# Patient Record
Sex: Male | Born: 1950 | Race: White | Hispanic: No
Health system: Southern US, Community
[De-identification: ages and names within clinical notes are randomized; demographics above are authoritative.]

---

## 2004-03-02 ENCOUNTER — Other Ambulatory Visit: Payer: Self-pay

## 2004-05-03 ENCOUNTER — Ambulatory Visit: Payer: Self-pay | Admitting: Urology

## 2004-05-11 ENCOUNTER — Ambulatory Visit: Payer: Self-pay | Admitting: Radiation Oncology

## 2004-06-01 ENCOUNTER — Ambulatory Visit: Payer: Self-pay | Admitting: Radiation Oncology

## 2004-07-01 ENCOUNTER — Ambulatory Visit: Payer: Self-pay | Admitting: Radiation Oncology

## 2004-07-14 ENCOUNTER — Ambulatory Visit: Payer: Self-pay | Admitting: Urology

## 2004-08-04 ENCOUNTER — Ambulatory Visit: Payer: Self-pay | Admitting: Anesthesiology

## 2004-08-19 ENCOUNTER — Ambulatory Visit: Payer: Self-pay | Admitting: Radiation Oncology

## 2004-08-31 ENCOUNTER — Ambulatory Visit: Payer: Self-pay | Admitting: Anesthesiology

## 2004-09-01 ENCOUNTER — Ambulatory Visit: Payer: Self-pay | Admitting: Radiation Oncology

## 2004-09-28 ENCOUNTER — Ambulatory Visit: Payer: Self-pay | Admitting: Anesthesiology

## 2004-11-01 ENCOUNTER — Ambulatory Visit: Payer: Self-pay | Admitting: Anesthesiology

## 2004-11-10 ENCOUNTER — Ambulatory Visit: Payer: Self-pay

## 2004-12-14 ENCOUNTER — Ambulatory Visit: Payer: Self-pay | Admitting: Anesthesiology

## 2004-12-22 ENCOUNTER — Ambulatory Visit: Payer: Self-pay | Admitting: Radiation Oncology

## 2004-12-30 ENCOUNTER — Ambulatory Visit: Payer: Self-pay | Admitting: Radiation Oncology

## 2005-01-12 ENCOUNTER — Ambulatory Visit: Payer: Self-pay | Admitting: Anesthesiology

## 2005-03-09 ENCOUNTER — Ambulatory Visit: Payer: Self-pay | Admitting: Anesthesiology

## 2005-04-14 ENCOUNTER — Ambulatory Visit: Payer: Self-pay | Admitting: Anesthesiology

## 2005-05-10 ENCOUNTER — Ambulatory Visit: Payer: Self-pay | Admitting: Anesthesiology

## 2005-06-16 ENCOUNTER — Ambulatory Visit: Payer: Self-pay | Admitting: Anesthesiology

## 2005-06-27 ENCOUNTER — Ambulatory Visit: Payer: Self-pay | Admitting: Radiation Oncology

## 2005-07-01 ENCOUNTER — Ambulatory Visit: Payer: Self-pay | Admitting: Radiation Oncology

## 2005-08-25 ENCOUNTER — Ambulatory Visit: Payer: Self-pay | Admitting: Anesthesiology

## 2005-09-26 ENCOUNTER — Ambulatory Visit: Payer: Self-pay | Admitting: Anesthesiology

## 2005-10-31 ENCOUNTER — Ambulatory Visit: Payer: Self-pay | Admitting: Anesthesiology

## 2005-12-19 ENCOUNTER — Ambulatory Visit: Payer: Self-pay | Admitting: Anesthesiology

## 2006-01-16 ENCOUNTER — Ambulatory Visit: Payer: Self-pay | Admitting: Anesthesiology

## 2006-03-21 ENCOUNTER — Ambulatory Visit: Payer: Self-pay | Admitting: Specialist

## 2006-05-25 ENCOUNTER — Ambulatory Visit: Payer: Self-pay | Admitting: Anesthesiology

## 2006-06-29 ENCOUNTER — Ambulatory Visit: Payer: Self-pay | Admitting: Anesthesiology

## 2006-08-15 ENCOUNTER — Ambulatory Visit: Payer: Self-pay | Admitting: Anesthesiology

## 2006-09-20 ENCOUNTER — Ambulatory Visit: Payer: Self-pay | Admitting: Anesthesiology

## 2006-10-26 ENCOUNTER — Ambulatory Visit: Payer: Self-pay | Admitting: Anesthesiology

## 2006-11-22 ENCOUNTER — Ambulatory Visit: Payer: Self-pay | Admitting: Anesthesiology

## 2006-12-27 ENCOUNTER — Ambulatory Visit: Payer: Self-pay | Admitting: Anesthesiology

## 2007-03-01 ENCOUNTER — Ambulatory Visit: Payer: Self-pay | Admitting: Anesthesiology

## 2007-04-11 ENCOUNTER — Ambulatory Visit: Payer: Self-pay | Admitting: Anesthesiology

## 2007-07-20 ENCOUNTER — Other Ambulatory Visit: Payer: Self-pay

## 2007-07-20 ENCOUNTER — Inpatient Hospital Stay: Payer: Self-pay | Admitting: Internal Medicine

## 2007-11-23 ENCOUNTER — Ambulatory Visit: Payer: Self-pay | Admitting: Otolaryngology

## 2008-06-12 ENCOUNTER — Inpatient Hospital Stay: Payer: Self-pay | Admitting: Internal Medicine

## 2008-07-09 ENCOUNTER — Encounter: Payer: Self-pay | Admitting: Internal Medicine

## 2008-08-01 ENCOUNTER — Encounter: Payer: Self-pay | Admitting: Internal Medicine

## 2008-09-01 ENCOUNTER — Encounter: Payer: Self-pay | Admitting: Internal Medicine

## 2008-09-29 ENCOUNTER — Encounter: Payer: Self-pay | Admitting: Internal Medicine

## 2008-10-30 ENCOUNTER — Encounter: Payer: Self-pay | Admitting: Internal Medicine

## 2008-11-29 ENCOUNTER — Encounter: Payer: Self-pay | Admitting: Internal Medicine

## 2010-03-24 ENCOUNTER — Inpatient Hospital Stay: Payer: Self-pay | Admitting: Internal Medicine

## 2010-04-07 ENCOUNTER — Inpatient Hospital Stay: Payer: Self-pay | Admitting: Internal Medicine

## 2010-04-21 ENCOUNTER — Inpatient Hospital Stay: Payer: Self-pay | Admitting: Internal Medicine

## 2010-05-07 ENCOUNTER — Observation Stay: Payer: Self-pay | Admitting: Internal Medicine

## 2012-04-17 ENCOUNTER — Ambulatory Visit: Payer: Self-pay | Admitting: Specialist

## 2012-04-24 ENCOUNTER — Ambulatory Visit: Payer: Self-pay | Admitting: Gastroenterology

## 2012-04-26 LAB — PATHOLOGY REPORT

## 2012-09-29 ENCOUNTER — Ambulatory Visit: Payer: Self-pay | Admitting: Internal Medicine

## 2012-10-07 ENCOUNTER — Inpatient Hospital Stay: Payer: Self-pay | Admitting: Student

## 2012-10-07 LAB — COMPREHENSIVE METABOLIC PANEL
Albumin: 3.1 g/dL — ABNORMAL LOW (ref 3.4–5.0)
Alkaline Phosphatase: 114 U/L (ref 50–136)
BUN: 14 mg/dL (ref 7–18)
Bilirubin,Total: 0.6 mg/dL (ref 0.2–1.0)
Calcium, Total: 9.2 mg/dL (ref 8.5–10.1)
Chloride: 86 mmol/L — ABNORMAL LOW (ref 98–107)
Creatinine: 0.72 mg/dL (ref 0.60–1.30)
Glucose: 75 mg/dL (ref 65–99)
Osmolality: 240 (ref 275–301)
Sodium: 119 mmol/L — CL (ref 136–145)
Total Protein: 8.2 g/dL (ref 6.4–8.2)

## 2012-10-07 LAB — CBC
HCT: 36.1 % — ABNORMAL LOW (ref 40.0–52.0)
HGB: 11.6 g/dL — ABNORMAL LOW (ref 13.0–18.0)
MCV: 76 fL — ABNORMAL LOW (ref 80–100)
RDW: 17.6 % — ABNORMAL HIGH (ref 11.5–14.5)

## 2012-10-07 LAB — TROPONIN I: Troponin-I: <0.02 ng/mL

## 2012-10-07 LAB — URINALYSIS, COMPLETE
Bacteria: NONE SEEN
Glucose,UR: NEGATIVE mg/dL (ref 0–75)
Hyaline Cast: 7
Protein: 30
RBC,UR: 3616 /HPF (ref 0–5)
Specific Gravity: 1.013 (ref 1.003–1.030)

## 2012-10-07 LAB — PRO B NATRIURETIC PEPTIDE: B-Type Natriuretic Peptide: 4300 pg/mL — ABNORMAL HIGH (ref 0–125)

## 2012-10-08 LAB — COMPREHENSIVE METABOLIC PANEL
Albumin: 2.5 g/dL — ABNORMAL LOW (ref 3.4–5.0)
BUN: 9 mg/dL (ref 7–18)
Bilirubin,Total: 0.3 mg/dL (ref 0.2–1.0)
Calcium, Total: 8.6 mg/dL (ref 8.5–10.1)
Chloride: 96 mmol/L — ABNORMAL LOW (ref 98–107)
Co2: 25 mmol/L (ref 21–32)
Creatinine: 0.5 mg/dL — ABNORMAL LOW (ref 0.60–1.30)
EGFR (African American): >60
EGFR (Non-African Amer.): >60
Glucose: 152 mg/dL — ABNORMAL HIGH (ref 65–99)
Osmolality: 257 (ref 275–301)
Potassium: 3.9 mmol/L (ref 3.5–5.1)
SGPT (ALT): 19 U/L (ref 12–78)

## 2012-10-08 LAB — CBC WITH DIFFERENTIAL/PLATELET
Basophil #: 0 x10 3/mm 3 (ref 0.0–0.1)
Basophil %: 0.1 %
Eosinophil #: 0 x10 3/mm 3 (ref 0.0–0.7)
Eosinophil %: 0 %
HCT: 30.4 % — ABNORMAL LOW (ref 40.0–52.0)
HGB: 10.1 g/dL — ABNORMAL LOW (ref 13.0–18.0)
Lymphocyte %: 0.9 %
Lymphs Abs: 0.2 x10 3/mm 3 — ABNORMAL LOW (ref 1.0–3.6)
MCH: 25.1 pg — ABNORMAL LOW (ref 26.0–34.0)
MCHC: 33.2 g/dL (ref 32.0–36.0)
MCV: 76 fL — ABNORMAL LOW (ref 80–100)
Monocyte #: 0.3 x10 3/mm (ref 0.2–1.0)
Monocyte %: 1.3 %
Neutrophil #: 22.6 x10 3/mm 3 — ABNORMAL HIGH (ref 1.4–6.5)
Neutrophil %: 97.7 %
Platelet: 136 x10 3/mm 3 — ABNORMAL LOW (ref 150–440)
RBC: 4.01 x10 6/mm 3 — ABNORMAL LOW (ref 4.40–5.90)
RDW: 17.2 % — ABNORMAL HIGH (ref 11.5–14.5)
WBC: 23.1 x10 3/mm 3 — ABNORMAL HIGH (ref 3.8–10.6)

## 2012-10-08 LAB — TROPONIN I: Troponin-I: <0.02 ng/mL

## 2012-10-09 LAB — URINALYSIS, COMPLETE
Bacteria: NONE SEEN
Bilirubin,UR: NEGATIVE
Blood: NEGATIVE
Glucose,UR: NEGATIVE mg/dL (ref 0–75)
Ketone: NEGATIVE
Leukocyte Esterase: NEGATIVE
Nitrite: NEGATIVE
Ph: 7 (ref 4.5–8.0)
Protein: NEGATIVE
RBC,UR: NONE SEEN /HPF (ref 0–5)
Specific Gravity: 1.009 (ref 1.003–1.030)
Squamous Epithelial: <1
WBC UR: <1 /HPF (ref 0–5)

## 2012-10-09 LAB — CBC WITH DIFFERENTIAL/PLATELET
Eosinophil #: 0 10*3/uL (ref 0.0–0.7)
Eosinophil %: 0 %
HCT: 29.4 % — ABNORMAL LOW (ref 40.0–52.0)
HGB: 9.4 g/dL — ABNORMAL LOW (ref 13.0–18.0)
Lymphocyte #: 0.4 10*3/uL — ABNORMAL LOW (ref 1.0–3.6)
MCH: 24.7 pg — ABNORMAL LOW (ref 26.0–34.0)
MCV: 77 fL — ABNORMAL LOW (ref 80–100)
Monocyte #: 0.3 x10 3/mm (ref 0.2–1.0)
Neutrophil #: 21 10*3/uL — ABNORMAL HIGH (ref 1.4–6.5)
RBC: 3.82 10*6/uL — ABNORMAL LOW (ref 4.40–5.90)
RDW: 17.6 % — ABNORMAL HIGH (ref 11.5–14.5)
WBC: 22 10*3/uL — ABNORMAL HIGH (ref 3.8–10.6)

## 2012-10-09 LAB — SODIUM: Sodium: 133 mmol/L — ABNORMAL LOW (ref 136–145)

## 2012-10-10 LAB — CBC WITH DIFFERENTIAL/PLATELET
Basophil #: 0 10*3/uL (ref 0.0–0.1)
Basophil %: 0.1 %
Eosinophil %: 0 %
HCT: 30.1 % — ABNORMAL LOW (ref 40.0–52.0)
HGB: 9.4 g/dL — ABNORMAL LOW (ref 13.0–18.0)
Lymphocyte #: 1.2 10*3/uL (ref 1.0–3.6)
MCV: 77 fL — ABNORMAL LOW (ref 80–100)
Monocyte #: 0.9 x10 3/mm (ref 0.2–1.0)
Monocyte %: 5 %
RBC: 3.88 10*6/uL — ABNORMAL LOW (ref 4.40–5.90)
WBC: 18.7 10*3/uL — ABNORMAL HIGH (ref 3.8–10.6)

## 2012-10-10 LAB — VANCOMYCIN, TROUGH: Vancomycin, Trough: 6 ug/mL — ABNORMAL LOW (ref 10–20)

## 2012-10-11 LAB — BASIC METABOLIC PANEL
BUN: 11 mg/dL (ref 7–18)
Calcium, Total: 9 mg/dL (ref 8.5–10.1)
Chloride: 89 mmol/L — ABNORMAL LOW (ref 98–107)
EGFR (Non-African Amer.): >60
Glucose: 72 mg/dL (ref 65–99)

## 2012-10-12 LAB — SODIUM: Sodium: 130 mmol/L — ABNORMAL LOW (ref 136–145)

## 2012-10-13 LAB — CBC WITH DIFFERENTIAL/PLATELET
Basophil #: 0.1 10*3/uL (ref 0.0–0.1)
Basophil %: 0.4 %
HCT: 34.6 % — ABNORMAL LOW (ref 40.0–52.0)
MCH: 24.8 pg — ABNORMAL LOW (ref 26.0–34.0)
MCHC: 31.7 g/dL — ABNORMAL LOW (ref 32.0–36.0)
MCV: 78 fL — ABNORMAL LOW (ref 80–100)
Neutrophil %: 76.7 %
RBC: 4.42 10*6/uL (ref 4.40–5.90)
RDW: 17.9 % — ABNORMAL HIGH (ref 11.5–14.5)
WBC: 18 10*3/uL — ABNORMAL HIGH (ref 3.8–10.6)

## 2012-10-13 LAB — SODIUM: Sodium: 130 mmol/L — ABNORMAL LOW (ref 136–145)

## 2012-10-13 LAB — CULTURE, BLOOD (SINGLE)

## 2012-10-14 LAB — CBC WITH DIFFERENTIAL/PLATELET
Eosinophil #: 0.2 10*3/uL (ref 0.0–0.7)
Eosinophil %: 0.7 %
HGB: 11.3 g/dL — ABNORMAL LOW (ref 13.0–18.0)
Lymphocyte #: 1.4 10*3/uL (ref 1.0–3.6)
MCH: 24.7 pg — ABNORMAL LOW (ref 26.0–34.0)
MCHC: 31.6 g/dL — ABNORMAL LOW (ref 32.0–36.0)
MCV: 78 fL — ABNORMAL LOW (ref 80–100)
Monocyte #: 2.2 x10 3/mm — ABNORMAL HIGH (ref 0.2–1.0)
Monocyte %: 8.6 %
Neutrophil %: 85.1 %
RBC: 4.55 10*6/uL (ref 4.40–5.90)
WBC: 25.4 10*3/uL — ABNORMAL HIGH (ref 3.8–10.6)

## 2012-10-14 LAB — PLATELET COUNT: Platelet: 411 10*3/uL (ref 150–440)

## 2012-10-14 LAB — EXPECTORATED SPUTUM ASSESSMENT W GRAM STAIN, RFLX TO RESP C

## 2012-10-15 LAB — CBC WITH DIFFERENTIAL/PLATELET
Basophil %: 1 %
Eosinophil #: 0.3 10*3/uL (ref 0.0–0.7)
Eosinophil %: 1.5 %
HCT: 32.7 % — ABNORMAL LOW (ref 40.0–52.0)
Lymphocyte %: 4.6 %
MCH: 25.2 pg — ABNORMAL LOW (ref 26.0–34.0)
MCHC: 32.2 g/dL (ref 32.0–36.0)
MCV: 78 fL — ABNORMAL LOW (ref 80–100)
Monocyte %: 7.4 %
Neutrophil #: 17.4 10*3/uL — ABNORMAL HIGH (ref 1.4–6.5)
Neutrophil %: 85.5 %
Platelet: 303 10*3/uL (ref 150–440)
RDW: 18.4 % — ABNORMAL HIGH (ref 11.5–14.5)
WBC: 20.4 10*3/uL — ABNORMAL HIGH (ref 3.8–10.6)

## 2012-10-15 LAB — BASIC METABOLIC PANEL
Anion Gap: 4 — ABNORMAL LOW (ref 7–16)
BUN: 25 mg/dL — ABNORMAL HIGH (ref 7–18)
Calcium, Total: 9.1 mg/dL (ref 8.5–10.1)
Chloride: 97 mmol/L — ABNORMAL LOW (ref 98–107)
Co2: 32 mmol/L (ref 21–32)
Creatinine: 0.41 mg/dL — ABNORMAL LOW (ref 0.60–1.30)
EGFR (Non-African Amer.): >60
Glucose: 91 mg/dL (ref 65–99)
Osmolality: 270 (ref 275–301)
Sodium: 133 mmol/L — ABNORMAL LOW (ref 136–145)

## 2012-10-16 LAB — CBC WITH DIFFERENTIAL/PLATELET
Basophil #: 0.1 10*3/uL (ref 0.0–0.1)
Basophil %: 0.4 %
Eosinophil #: 0.1 10*3/uL (ref 0.0–0.7)
Eosinophil %: 0.7 %
HCT: 31.2 % — ABNORMAL LOW (ref 40.0–52.0)
HGB: 9.8 g/dL — ABNORMAL LOW (ref 13.0–18.0)
Lymphocyte #: 0.7 10*3/uL — ABNORMAL LOW (ref 1.0–3.6)
MCHC: 31.5 g/dL — ABNORMAL LOW (ref 32.0–36.0)
Monocyte #: 1.5 x10 3/mm — ABNORMAL HIGH (ref 0.2–1.0)
Platelet: 139 10*3/uL — ABNORMAL LOW (ref 150–440)
RDW: 18.6 % — ABNORMAL HIGH (ref 11.5–14.5)
WBC: 19.6 10*3/uL — ABNORMAL HIGH (ref 3.8–10.6)

## 2012-10-16 LAB — CULTURE, BLOOD (SINGLE)

## 2012-10-30 ENCOUNTER — Ambulatory Visit: Payer: Self-pay | Admitting: Internal Medicine

## 2012-11-14 ENCOUNTER — Emergency Department: Payer: Self-pay | Admitting: Unknown Physician Specialty

## 2012-11-14 LAB — COMPREHENSIVE METABOLIC PANEL
Albumin: 3.5 g/dL (ref 3.4–5.0)
Alkaline Phosphatase: 108 U/L (ref 50–136)
Anion Gap: 7 (ref 7–16)
Chloride: 90 mmol/L — ABNORMAL LOW (ref 98–107)
Co2: 26 mmol/L (ref 21–32)
EGFR (African American): >60
Glucose: 63 mg/dL — ABNORMAL LOW (ref 65–99)
Osmolality: 244 (ref 275–301)
SGOT(AST): 30 U/L (ref 15–37)
SGPT (ALT): 19 U/L (ref 12–78)
Sodium: 123 mmol/L — ABNORMAL LOW (ref 136–145)

## 2012-11-14 LAB — URINALYSIS, COMPLETE
Bilirubin,UR: NEGATIVE
Ketone: NEGATIVE
Nitrite: NEGATIVE
Ph: 6 (ref 4.5–8.0)
RBC,UR: 4596 /HPF (ref 0–5)
Specific Gravity: 1.011 (ref 1.003–1.030)
WBC UR: 27 /HPF (ref 0–5)

## 2012-11-14 LAB — CBC
HGB: 10.8 g/dL — ABNORMAL LOW (ref 13.0–18.0)
MCH: 25.5 pg — ABNORMAL LOW (ref 26.0–34.0)
MCV: 79 fL — ABNORMAL LOW (ref 80–100)
Platelet: 297 10*3/uL (ref 150–440)
RDW: 18.6 % — ABNORMAL HIGH (ref 11.5–14.5)
WBC: 13.8 10*3/uL — ABNORMAL HIGH (ref 3.8–10.6)

## 2012-11-14 LAB — PROTIME-INR
INR: 1.1
Prothrombin Time: 14.5 secs (ref 11.5–14.7)

## 2012-11-14 LAB — APTT: Activated PTT: 35 secs (ref 23.6–35.9)

## 2012-11-15 LAB — URINE CULTURE

## 2013-03-01 ENCOUNTER — Ambulatory Visit: Payer: Self-pay | Admitting: Internal Medicine

## 2013-03-03 ENCOUNTER — Inpatient Hospital Stay: Payer: Self-pay | Admitting: Internal Medicine

## 2013-03-03 LAB — COMPREHENSIVE METABOLIC PANEL
Alkaline Phosphatase: 106 U/L (ref 50–136)
Anion Gap: 8 (ref 7–16)
Bilirubin,Total: 0.4 mg/dL (ref 0.2–1.0)
Chloride: 84 mmol/L — ABNORMAL LOW (ref 98–107)
Creatinine: 0.9 mg/dL (ref 0.60–1.30)
EGFR (African American): >60
EGFR (Non-African Amer.): >60
Glucose: 91 mg/dL (ref 65–99)
Osmolality: 237 (ref 275–301)
Potassium: 5 mmol/L (ref 3.5–5.1)
SGPT (ALT): 22 U/L (ref 12–78)
Total Protein: 7.6 g/dL (ref 6.4–8.2)

## 2013-03-03 LAB — URINALYSIS, COMPLETE
Bacteria: NONE SEEN
Blood: NEGATIVE
Glucose,UR: NEGATIVE mg/dL (ref 0–75)
Leukocyte Esterase: NEGATIVE
Ph: 7 (ref 4.5–8.0)
RBC,UR: <1 /HPF (ref 0–5)
Specific Gravity: 1.013 (ref 1.003–1.030)
WBC UR: 2 /HPF (ref 0–5)

## 2013-03-03 LAB — HEPATIC FUNCTION PANEL A (ARMC)
Albumin: 2.8 g/dL — ABNORMAL LOW (ref 3.4–5.0)
Bilirubin,Total: 0.4 mg/dL (ref 0.2–1.0)
SGOT(AST): 85 U/L — ABNORMAL HIGH (ref 15–37)
SGPT (ALT): 22 U/L (ref 12–78)
Total Protein: 7.3 g/dL (ref 6.4–8.2)

## 2013-03-03 LAB — CBC
HGB: 11.1 g/dL — ABNORMAL LOW (ref 13.0–18.0)
MCHC: 34.3 g/dL (ref 32.0–36.0)
RBC: 4.33 10*6/uL — ABNORMAL LOW (ref 4.40–5.90)
RDW: 15.5 % — ABNORMAL HIGH (ref 11.5–14.5)
WBC: 29.4 10*3/uL — ABNORMAL HIGH (ref 3.8–10.6)

## 2013-03-03 LAB — CK TOTAL AND CKMB (NOT AT ARMC)
CK, Total: 4064 U/L — ABNORMAL HIGH (ref 35–232)
CK-MB: 4.1 ng/mL — ABNORMAL HIGH (ref 0.5–3.6)
CK-MB: 3.9 ng/mL — ABNORMAL HIGH (ref 0.5–3.6)

## 2013-03-03 LAB — LIPASE, BLOOD: Lipase: 39 U/L — ABNORMAL LOW (ref 73–393)

## 2013-03-03 LAB — TROPONIN I: Troponin-I: <0.02 ng/mL

## 2013-03-04 LAB — CBC WITH DIFFERENTIAL/PLATELET
Basophil #: 0 10*3/uL (ref 0.0–0.1)
Basophil %: 0.2 %
Eosinophil %: 0.2 %
HCT: 27.9 % — ABNORMAL LOW (ref 40.0–52.0)
Lymphocyte #: 0.5 10*3/uL — ABNORMAL LOW (ref 1.0–3.6)
Lymphocyte %: 2.9 %
MCV: 74 fL — ABNORMAL LOW (ref 80–100)
Monocyte #: 1.3 x10 3/mm — ABNORMAL HIGH (ref 0.2–1.0)
Neutrophil #: 16.5 10*3/uL — ABNORMAL HIGH (ref 1.4–6.5)
Platelet: 184 10*3/uL (ref 150–440)
RDW: 15.6 % — ABNORMAL HIGH (ref 11.5–14.5)
WBC: 18.4 10*3/uL — ABNORMAL HIGH (ref 3.8–10.6)

## 2013-03-04 LAB — BASIC METABOLIC PANEL
Anion Gap: 7 (ref 7–16)
Creatinine: 0.54 mg/dL — ABNORMAL LOW (ref 0.60–1.30)
Glucose: 80 mg/dL (ref 65–99)
Osmolality: 254 (ref 275–301)
Potassium: 4 mmol/L (ref 3.5–5.1)
Sodium: 128 mmol/L — ABNORMAL LOW (ref 136–145)

## 2013-03-04 LAB — VANCOMYCIN, TROUGH: Vancomycin, Trough: 7 ug/mL — ABNORMAL LOW (ref 10–20)

## 2013-03-04 LAB — URINE CULTURE

## 2013-03-05 LAB — BASIC METABOLIC PANEL
Anion Gap: 3 — ABNORMAL LOW (ref 7–16)
BUN: 6 mg/dL — ABNORMAL LOW (ref 7–18)
Calcium, Total: 8.7 mg/dL (ref 8.5–10.1)
Chloride: 97 mmol/L — ABNORMAL LOW (ref 98–107)
Co2: 31 mmol/L (ref 21–32)
Creatinine: 0.4 mg/dL — ABNORMAL LOW (ref 0.60–1.30)
EGFR (Non-African Amer.): >60
Glucose: 82 mg/dL (ref 65–99)
Osmolality: 259 (ref 275–301)
Sodium: 131 mmol/L — ABNORMAL LOW (ref 136–145)

## 2013-03-05 LAB — CBC WITH DIFFERENTIAL/PLATELET
Eosinophil #: 0.1 10*3/uL (ref 0.0–0.7)
Eosinophil %: 0.8 %
HCT: 28.2 % — ABNORMAL LOW (ref 40.0–52.0)
HGB: 9.6 g/dL — ABNORMAL LOW (ref 13.0–18.0)
Lymphocyte %: 6.1 %
Monocyte #: 1.3 x10 3/mm — ABNORMAL HIGH (ref 0.2–1.0)
Monocyte %: 13.2 %
Neutrophil %: 79.4 %
Platelet: 213 10*3/uL (ref 150–440)
RDW: 15.6 % — ABNORMAL HIGH (ref 11.5–14.5)

## 2013-03-05 LAB — IRON AND TIBC
Iron Bind.Cap.(Total): 185 ug/dL — ABNORMAL LOW (ref 250–450)
Iron Saturation: 11 %
Iron: 21 ug/dL — ABNORMAL LOW (ref 65–175)
Unbound Iron-Bind.Cap.: 164 ug/dL

## 2013-03-05 LAB — VANCOMYCIN, TROUGH: Vancomycin, Trough: 19 ug/mL (ref 10–20)

## 2013-03-05 LAB — CK: CK, Total: 496 U/L — ABNORMAL HIGH (ref 35–232)

## 2013-03-06 LAB — CBC WITH DIFFERENTIAL/PLATELET
Basophil %: 0.5 %
Eosinophil #: 0.1 10*3/uL (ref 0.0–0.7)
Eosinophil %: 1.1 %
HGB: 10.1 g/dL — ABNORMAL LOW (ref 13.0–18.0)
Lymphocyte #: 0.8 10*3/uL — ABNORMAL LOW (ref 1.0–3.6)
MCH: 25.5 pg — ABNORMAL LOW (ref 26.0–34.0)
Neutrophil #: 11.4 10*3/uL — ABNORMAL HIGH (ref 1.4–6.5)
Neutrophil %: 82.1 %
Platelet: 126 10*3/uL — ABNORMAL LOW (ref 150–440)
RDW: 16 % — ABNORMAL HIGH (ref 11.5–14.5)
WBC: 13.9 10*3/uL — ABNORMAL HIGH (ref 3.8–10.6)

## 2013-03-06 LAB — CK: CK, Total: 171 U/L (ref 35–232)

## 2013-03-06 LAB — BASIC METABOLIC PANEL
Chloride: 90 mmol/L — ABNORMAL LOW (ref 98–107)
Co2: 34 mmol/L — ABNORMAL HIGH (ref 21–32)
Creatinine: 0.37 mg/dL — ABNORMAL LOW (ref 0.60–1.30)
EGFR (African American): >60
EGFR (Non-African Amer.): >60
Glucose: 99 mg/dL (ref 65–99)
Osmolality: 256 (ref 275–301)
Potassium: 4.4 mmol/L (ref 3.5–5.1)
Sodium: 129 mmol/L — ABNORMAL LOW (ref 136–145)

## 2013-03-07 LAB — CBC WITH DIFFERENTIAL/PLATELET
Basophil #: 0.1 10*3/uL (ref 0.0–0.1)
Basophil %: 0.5 %
Eosinophil #: 0.2 10*3/uL (ref 0.0–0.7)
HCT: 34.7 % — ABNORMAL LOW (ref 40.0–52.0)
HGB: 11.6 g/dL — ABNORMAL LOW (ref 13.0–18.0)
Lymphocyte #: 1.1 10*3/uL (ref 1.0–3.6)
Lymphocyte %: 8.6 %
MCV: 77 fL — ABNORMAL LOW (ref 80–100)
Monocyte #: 1.6 x10 3/mm — ABNORMAL HIGH (ref 0.2–1.0)
Monocyte %: 13.4 %
Neutrophil #: 9.3 10*3/uL — ABNORMAL HIGH (ref 1.4–6.5)
Platelet: 244 10*3/uL (ref 150–440)
RBC: 4.53 10*6/uL (ref 4.40–5.90)
RDW: 15.7 % — ABNORMAL HIGH (ref 11.5–14.5)

## 2013-03-07 LAB — OCCULT BLOOD X 1 CARD TO LAB, STOOL: Occult Blood, Feces: NEGATIVE

## 2013-03-07 LAB — BASIC METABOLIC PANEL
Anion Gap: 2 — ABNORMAL LOW (ref 7–16)
BUN: 8 mg/dL (ref 7–18)
Chloride: 92 mmol/L — ABNORMAL LOW (ref 98–107)
EGFR (Non-African Amer.): >60
Osmolality: 258 (ref 275–301)
Potassium: 4.7 mmol/L (ref 3.5–5.1)

## 2013-03-08 LAB — CULTURE, BLOOD (SINGLE)

## 2013-03-10 LAB — CULTURE, BLOOD (SINGLE)

## 2013-04-01 ENCOUNTER — Ambulatory Visit: Payer: Self-pay | Admitting: Internal Medicine

## 2013-04-11 ENCOUNTER — Ambulatory Visit: Payer: Self-pay | Admitting: Specialist

## 2013-04-13 LAB — CBC WITH DIFFERENTIAL/PLATELET
Basophil #: 0.1 10*3/uL (ref 0.0–0.1)
Eosinophil #: 0.2 10*3/uL (ref 0.0–0.7)
HCT: 35.4 % — ABNORMAL LOW (ref 40.0–52.0)
HGB: 11.8 g/dL — ABNORMAL LOW (ref 13.0–18.0)
Lymphocyte #: 0.7 10*3/uL — ABNORMAL LOW (ref 1.0–3.6)
Lymphocyte %: 3.8 %
MCH: 25.1 pg — ABNORMAL LOW (ref 26.0–34.0)
MCV: 76 fL — ABNORMAL LOW (ref 80–100)
Monocyte #: 0.6 x10 3/mm (ref 0.2–1.0)
Neutrophil %: 91.5 %
Platelet: 146 10*3/uL — ABNORMAL LOW (ref 150–440)
RBC: 4.69 10*6/uL (ref 4.40–5.90)
RDW: 17.7 % — ABNORMAL HIGH (ref 11.5–14.5)

## 2013-04-13 LAB — COMPREHENSIVE METABOLIC PANEL
BUN: 9 mg/dL (ref 7–18)
Bilirubin,Total: 0.5 mg/dL (ref 0.2–1.0)
Chloride: 86 mmol/L — ABNORMAL LOW (ref 98–107)
Creatinine: 0.62 mg/dL (ref 0.60–1.30)
EGFR (African American): >60
EGFR (Non-African Amer.): >60
Glucose: 69 mg/dL (ref 65–99)
Osmolality: 239 (ref 275–301)
SGPT (ALT): 18 U/L (ref 12–78)
Sodium: 120 mmol/L — CL (ref 136–145)
Total Protein: 8.2 g/dL (ref 6.4–8.2)

## 2013-04-13 LAB — CK TOTAL AND CKMB (NOT AT ARMC)
CK, Total: 121 U/L (ref 35–232)
CK-MB: 2 ng/mL (ref 0.5–3.6)

## 2013-04-13 LAB — TROPONIN I: Troponin-I: <0.02 ng/mL

## 2013-04-14 ENCOUNTER — Inpatient Hospital Stay: Payer: Self-pay | Admitting: Internal Medicine

## 2013-04-14 LAB — BASIC METABOLIC PANEL
BUN: 8 mg/dL (ref 7–18)
Calcium, Total: 8 mg/dL — ABNORMAL LOW (ref 8.5–10.1)
Co2: 27 mmol/L (ref 21–32)
EGFR (African American): >60
Glucose: 77 mg/dL (ref 65–99)
Sodium: 123 mmol/L — ABNORMAL LOW (ref 136–145)

## 2013-04-14 LAB — URINALYSIS, COMPLETE
Bacteria: NONE SEEN
Glucose,UR: NEGATIVE mg/dL (ref 0–75)
Granular Cast: 3
Ketone: NEGATIVE
Leukocyte Esterase: NEGATIVE
Nitrite: NEGATIVE
RBC,UR: <1 /HPF (ref 0–5)

## 2013-04-15 LAB — CBC WITH DIFFERENTIAL/PLATELET
Basophil %: 0.1 %
Eosinophil %: 0.1 %
HCT: 29.8 % — ABNORMAL LOW (ref 40.0–52.0)
Lymphocyte #: 0.5 10*3/uL — ABNORMAL LOW (ref 1.0–3.6)
MCH: 25.7 pg — ABNORMAL LOW (ref 26.0–34.0)
MCHC: 33.6 g/dL (ref 32.0–36.0)
MCV: 77 fL — ABNORMAL LOW (ref 80–100)
Monocyte %: 5.4 %
Neutrophil %: 91.7 %
RDW: 18.2 % — ABNORMAL HIGH (ref 11.5–14.5)

## 2013-04-15 LAB — BASIC METABOLIC PANEL
Anion Gap: 6 — ABNORMAL LOW (ref 7–16)
Calcium, Total: 8.4 mg/dL — ABNORMAL LOW (ref 8.5–10.1)
Co2: 25 mmol/L (ref 21–32)
Creatinine: 0.5 mg/dL — ABNORMAL LOW (ref 0.60–1.30)
EGFR (African American): >60
EGFR (Non-African Amer.): >60
Glucose: 90 mg/dL (ref 65–99)
Osmolality: 258 (ref 275–301)

## 2013-04-16 LAB — CBC WITH DIFFERENTIAL/PLATELET
Basophil #: 0 x10 3/mm 3
Basophil %: 0.1 %
Eosinophil #: 0.1 x10 3/mm 3
Eosinophil %: 0.5 %
HCT: 28.4 % — ABNORMAL LOW
HGB: 9.4 g/dL — ABNORMAL LOW
Lymphocyte %: 3.8 %
Lymphs Abs: 0.5 x10 3/mm 3 — ABNORMAL LOW
MCH: 25.5 pg — ABNORMAL LOW
MCHC: 33 g/dL
MCV: 77 fL — ABNORMAL LOW
Monocyte #: 1.1 "x10 3/mm " — ABNORMAL HIGH
Monocyte %: 8.8 %
Neutrophil #: 11.3 x10 3/mm 3 — ABNORMAL HIGH
Neutrophil %: 86.8 %
Platelet: 152 x10 3/mm 3
RBC: 3.69 x10 6/mm 3 — ABNORMAL LOW
RDW: 18.1 % — ABNORMAL HIGH
WBC: 13 x10 3/mm 3 — ABNORMAL HIGH

## 2013-04-16 LAB — BASIC METABOLIC PANEL WITH GFR
Anion Gap: 3 — ABNORMAL LOW
BUN: 5 mg/dL — ABNORMAL LOW
Calcium, Total: 8.9 mg/dL
Chloride: 96 mmol/L — ABNORMAL LOW
Co2: 32 mmol/L
Creatinine: 0.48 mg/dL — ABNORMAL LOW
EGFR (African American): >60
EGFR (Non-African Amer.): >60
Glucose: 101 mg/dL — ABNORMAL HIGH
Osmolality: 260
Potassium: 3.9 mmol/L
Sodium: 131 mmol/L — ABNORMAL LOW

## 2013-04-16 LAB — VANCOMYCIN, RANDOM: Vancomycin, Random: 6 ug/mL

## 2013-04-18 LAB — CULTURE, BLOOD (SINGLE)

## 2013-05-01 ENCOUNTER — Ambulatory Visit: Payer: Self-pay | Admitting: Internal Medicine

## 2013-05-01 DEATH — deceased

## 2014-11-21 NOTE — Discharge Summary (Signed)
PATIENT NAME:  Henry Burns, Henry Burns MR#:  [id number] DATE OF BIRTH:  October 27, 1950  DATE OF ADMISSION:  10/07/2012 DATE OF DISCHARGE:  10/17/2012  DISCHARGE DIAGNOSES: 1. Methicillin-sensitive Staphylococcus aureus bacteremia.  2. Generalized weakness.  3. Pneumonia, likely aspiration.  4. Dysphagia.  5. Chronic obstructive pulmonary disease with exacerbation.  6. Hypotension.  7. Hyponatremia.  8. History of agitation with psychosis.  9. Anemia.  10. Systemic inflammatory response syndrome.  11. History of psychosis in the past.  12. History of chronic alcohol abuse in the past.  13. Dementia.  14. History of prostate cancer.  15. History of chronic back pain.  16. Chronic hyponatremia.  17. Degenerative disk disease.  18. History of pulmonary embolus.  19. History of depression   CHIEF COMPLAINT: Weakness, low blood pressure.   HISTORY AND PHYSICAL:  For H and P and previous dictated summary, please see the dictation on March 9th and then March 14th by Dr. Winona LegatoVaickute.   NOTE:  This discharge summary should cover March 15th until 19th.    CONSULTANTS:  Dr. Leavy CellaBlocker from Infectious Disease, Dr. Lady GaryFath for a TEE, and a speech and swallow specialist.   DISCHARGE MEDICATIONS: Ferrous sulfate 325 mg daily, Protonix 40 mg daily, trazodone 50 mg at bedtime, Amitriptyline 100 mg at bedtime, Wellbutrin 150 mg per 24 hours oral tab 1 tab every 24 hours, acetaminophen 325 mg 2 tabs every 4 hours as needed for pain or temperature, olanzapine 2.5 mg in the morning and 2.5 mg at bedtime, clonazepam 0.5 mg 2 times a day, Advair 250/50 mcg inhaled 1 puff 2 times a day, oxacillin 2 grams via PICC line for every 4 hours through 10/24/2012, thiamine 100 mg once a day, folic acid 1 mg daily.   DIET: Low sodium, diet consistency of mechanical soft with pureed meats, nectar-thick liquids with full aspiration precautions and meds in puree, moistened food.   ACTIVITY: As tolerated.   FOLLOWUP: Follow with PCP  within 1 to 2 weeks. Check a CBC and BMP in a couple of days.   HOSPITAL COURSE:  Since the 15th, the patient has done reasonably well. He has been on antibiotics. He was seen by Infectious Disease, Dr. Leavy CellaBlocker. He was initially on Levaquin and vancomycin. He underwent a TEE, which did not show any vegetations. His antibiotics have hence been changed to oxacillin to be taken until the last day on 10/24/2012. He has a second set of blood cultures which have been no growth to date. He has no fever, nor is he hypoxic, and at this point he will be discharged. He was also seen by Palliative Care, and he is DNR. His sodium has improved with fluid restriction. He had a PICC line placed on 10/17/2012 for the administration of IV antibiotics.   TOTAL TIME SPENT: 33 minutes.  ____________________________ Krystal EatonShayiq Maranatha Grossi, MD sa:cb D: 10/17/2012 13:53:47 ET T: 10/17/2012 14:04:38 ET JOB#: [id number]  cc: Krystal EatonShayiq Mariellen Blaney, MD, <Dictator> Teena Iraniavid M. Terance HartBronstein, MD Krystal EatonSHAYIQ Seriyah Collison MD ELECTRONICALLY SIGNED 10/23/2012 13:29

## 2014-11-21 NOTE — H&P (Signed)
PATIENT NAME:  Henry Burns, Henry Burns MR#:  [id number] DATE OF BIRTH:  18-Nov-1950  DATE OF ADMISSION:  04/14/2013  REFERRING PHYSICIAN:  Dr. Francene Castle.   PRIMARY CARE PHYSICIAN:  Dr. Juluis Pitch.   CHIEF COMPLAINT:  Altered mental status, hypoxia.   HISTORY OF PRESENT ILLNESS:  This is a 64 year old male with history of dementia due to alcohol abuse and drug abuse in the past, the patient had multiple hospitalizations in the recent past due to aspiration pneumonia from chronic dysphagia, the patient presents from nursing home for lethargy, altered mental status and hypoxia, the patient was saturating 62 at room air in the nursing home, as well was minimally responsive and lethargic, in the ED, the patient was found to be hypotensive, tachycardic and extremely hypoxic where currently he is saturating 96% on 4 liters nasal cannula, the patient's chest x-ray has chronic right lung changes and severe and significant opacity and infiltrate cannot be ruled out, but the patient has been coughing, as well the patient had a chronic indwelling Foley catheter, urinalysis is still pending, the patient was tachycardic, hypoxic, hypotensive, febrile at 102.3, with significant leukocytosis, the patient was started on IV Levaquin in the ED, the patient's CT brain did not show any acute findings, hospitalist service were requested to admit the patient for further management and work-up of his encephalopathy and sepsis.  PAST MEDICAL HISTORY:   1.  History of MRSA bacteremia in the past.  2.  History of generalized weakness.  3.  Dementia.  4.  Recurrent aspiration pneumonia.  5.  Chronic dysphagia.  6.  COPD.  7.  Hypertension.  8.  Agitation and psychosis.  9.  Chronic anemia.  10.  Chronic alcohol abuse and substance abuse in the past.   11.  Prostate cancer.  12.  Chronic back pain.  13.  Degenerative disk disease.  14.  History of pulmonary embolism.  15.  Depression.  16.  Tobacco abuse in the  past.  17.  History of arthritis.   ALLERGIES:  1.  DARVON.  2.  KEFLEX.  3.  SKELAXIN.  4.  TIZANIDINE.   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   FAMILY HISTORY:  Significant for coronary artery disease according to medical records.   SOCIAL HISTORY:  Currently, the patient is a nursing home resident.  Has history of smoking in the past.  Has history of alcohol and substance abuse as well in the past.   CURRENT MEDICATIONS:   1.  Hydrocodone/acetaminophen 5/325 1 tablet every six hours as needed.  2.  Advair 250/50 Diskus 1 puff twice daily.  3.  Albuterol as needed.  4.  Omeprazole 20 mg daily.  5.  Olanzapine 2.5 mg oral at bedtime.  6.  Tramadol 50 mg every six hours as needed.  7.  Klonopin 0.5 mg every 12 hours as needed.  8.  Tums as needed.  9.  Reglan 5 mL every eight hours as needed.  10.  Vitamin B12 100 mg oral daily.  11.  Wellbutrin XL 150 mg oral daily.  12.  Protonix 40 mg oral daily.  13.  Thiamin 100 mg oral daily.  14.  Ferrous sulfate 325 mg oral daily.  15.  Folic acid 1 mg oral daily.  16.  Boost drink 2 bottles per day.  17.  Elavil 50 mg 2 tablets oral bedtime.   REVIEW OF SYSTEMS: The patient is lethargic, cannot obtain any review of systems  from him.   PHYSICAL EXAMINATION: VITAL SIGNS:  Temperature 102.3, pulse 114, respiratory rate 35, blood pressure 102/63.  At one point on the telemetry monitor used to be 94/50, pulse 94% on 4 liters nasal cannula.  GENERAL:  Chronically ill-appearing cachectic male lies in bed in respiratory distress, minimally responsive to loud verbal stimuli.  HEENT:  Head atraumatic, normocephalic.  Pupils equally reactive to light.  Pale conjunctivae.  Anicteric sclerae.  Dry oral mucosa.  Nasal exam showing no drainage.  NECK:  Supple.  No thyromegaly.  No JVD.  No carotid bruits.  CHEST:  The patient had fair air entry bilaterally with bilateral rhonchi on both lungs.  No crackles, no  wheezing.  CARDIOVASCULAR:  S1, S2 heard.  No rubs, murmur, gallops.  Tachycardic, but regular.  ABDOMEN:  Soft, nontender, nondistended.  Bowel sounds present.  EXTREMITIES:  No edema.  No clubbing.  No cyanosis.  SKIN:  No rash.  Warm and dry.  LYMPHATICS:  No cervical or supraclavicular lymphadenopathy.  MUSCULOSKELETAL:  Has good dorsalis pedis pulses.  No erythema or swelling in his joints.  NEUROLOGIC:  The patient is lethargic, only arousable to loud verbal stimuli.  Moans, but does not follow any commands.  Does not answer any questions.  Unable to have appropriate neurologic exam secondary to his mental status, but appears to be moving all extremities without gross deficit.   PERTINENT LABORATORY DATA:  Glucose 69, BUN 9, creatinine 0.62, sodium 120, potassium 5.2, chloride 86, CO2 30, ALT 18, AST 28, alk phos 110.  Troponin less than 0.02.  Total CK 121, CK-MB 2.  White blood cell 15.7, hemoglobin 11.8, hematocrit 35.4, platelets 146, pH 7.38, pCO2 48, pO2 of 138, lactic acid 1.4.   ASSESSMENT AND PLAN: 1.  Acute encephalopathy, the patient has poor line mentation at baseline, but he appears to be extremely lethargic and nonresponsive, this is due to metabolic encephalopathy related to his sepsis and hyponatremia.  CT brain does not show any acute finding.  2.  Sepsis.  This is most likely related to healthcare-acquired pneumonia/aspiration pneumonia.  The patient is febrile, tachycardic, hypotensive with significant leukocytosis, as well we will check urinalysis as he is having a chronic indwelling Foley catheter, the patient was started on broad-spectrum antibiotic and had blood cultures, urinalysis done as well.  3.  Healthcare-acquired pneumonia, the patient will be started on IV vancomycin, Levaquin and cefepime, will be kept on oxygen as needed to keep his O2 sat above 95.  4.  Acute hypoxic respiratory failure, this is related to patient's pneumonia.  Continue with oxygen.  5.   Hyponatremia.  This is due to volume depletion and dehydration.  We will continue with IV normal saline.  We will check level in a.m.  6.  Dysphasia, has history of chronic dysphagia, most likely had aspiration episode caused his pneumonia, we will keep him nothing by mouth.  We will have speech and swallow evaluate him when his mentation improves.  7.  History of dementia and psychosis, the patient is currently nothing by mouth as he is high risk for aspiration, his by mouth meds can be resumed when he is able to tolerate by mouth intake safely.  8.  DVT prophylaxis.  SubQ heparin.  9.  CODE STATUS discussed at length with his next of kin his sister and he will remain DO NOT RESUSCITATE/DO NOT INTUBATE.   Total time spent on admission and patient care 65 minutes.    ____________________________ Emeline Gins  Graciela Husbands, MD dse:ea D: 04/14/2013 01:11:59 ET T: 04/14/2013 01:35:44 ET JOB#: [id number]  cc: Albertine Patricia, MD, <Dictator> DAWOOD Graciela Husbands MD ELECTRONICALLY SIGNED 04/14/2013 23:31

## 2014-11-21 NOTE — Op Note (Signed)
PATIENT NAME:  Henry Burns, Henry K MR#:  [id number] DATE OF BIRTH:  12-May-1951  DATE OF PROCEDURE:  10/17/2012  PREOPERATIVE DIAGNOSES: 1.  Methicillin-sensitive Staphylococcus aureus bacteremia.  2.  Generalized weakness.  3.  Aspiration pneumonia.  4.  Chronic obstructive pulmonary disease.  5.  Hyponatremia.  6.  Poor venous access.  POSTOPERATIVE DIAGNOSES: 1.  Methicillin-sensitive Staphylococcus aureus bacteremia.  2.  Generalized weakness.  3.  Aspiration pneumonia.  4.  Chronic obstructive pulmonary disease.  5.  Hyponatremia.  6.  Poor venous access.  PROCEDURES:  1. Ultrasound guidance for vascular access to right basilic vein.  2. Fluoroscopic guidance for placement of catheter.  3. Insertion of peripherally inserted central venous catheter, double lumen, right arm.  SURGEON:  Kyreese Chio  ANESTHESIA: Local.   ESTIMATED BLOOD LOSS: Minimal.   INDICATION FOR PROCEDURE: A 64 year old individual with bacteremia and multiple other ongoing issues. We are asked to place a PICC line.   DESCRIPTION OF PROCEDURE: The patient's right arm was sterilely prepped and draped, and a sterile surgical field was created. The right basilic vein was accessed under direct ultrasound guidance without difficulty with a micropuncture needle and permanent image was recorded. 0.018 wire was then placed into the superior vena cava. Peel-away sheath was placed over the wire. A double lumen peripherally inserted central venous catheter was then placed over the wire and the wire and peel-away sheath were removed. The catheter tip was placed into the superior vena cava and was secured at the skin at 30 cm with a sterile dressing. The catheter withdrew blood well and flushed easily with heparinized saline. The patient tolerated procedure well.    ____________________________ Annice NeedyJason S. Tena Linebaugh, MD jsd:ce D: 10/17/2012 13:18:00 ET T: 10/17/2012 14:28:16 ET JOB#: [id number]  cc: Annice NeedyJason S. Maks Cavallero, MD, <Dictator> Annice NeedyJASON S  Arnetta Odeh MD ELECTRONICALLY SIGNED 10/19/2012 14:17

## 2014-11-21 NOTE — Consult Note (Signed)
Impression:    64yo male w/ h/o COPD, alcohol abuse, hyponatremia admitted with Methacillin Sensitive Staph aureus bacteremia.     Review of his CT scan does not show an obvious pneumonia. He has some increased findings that are noted to be mild changes.  It is unclear to me that he has pneumonia.   It is possible that he could have aspirated.  He has chronic cough and SOB which are not worsened from his baseline.  Sputum cx is negative.  He could have aspirated which would not necessarily require antibiotics.    He has an allergy to keflex and was put on ceftriaxone.  He reports that his allergy was numbness and tingling rather than rash, but if he is having a reaction to ceftriaxone, this could cause his increase in WBC.    I would not treat his Methacillin Sensitive Staph aureus with levofloxacin.  Will start oxacillin.    There were several valvular abnormalities noted on TTE, but vegetations were not commented upon.  As he has Staph aureus bacteremia without obvious source, he will need a TEE.    Will treat with oxacillin for 2-4 weeks depending on the results of the TEE.    Follow CBC.    d/c vanco, zosyn and levofloxacin. 8)     Given his leukocytosis, if he develops diarrhea, would send C. diff PCR.  Electronic Signatures: Hiran Leard MPH, Rosalyn GessMichael E (MD) (Signed on 17-Mar-14 15:00)  Authored   Last Updated: 17-Mar-14 15:14 by Gabi Mcfate MPH, Rosalyn GessMichael E (MD)

## 2014-11-21 NOTE — Discharge Summary (Signed)
PATIENT NAME:  Henry Burns, Henry Burns MR#:  [id number] DATE OF BIRTH:  12-18-1950  DATE OF ADMISSION:  04/14/2013 DATE OF DISCHARGE:  04/18/2013  DISPOSITION: Discharged to hospice home.  DISCHARGE DIAGNOSES: 1.  Acute metabolic encephalopathy secondary to hyponatremia.  2.  Hypovolemic hyponatremia.  3.  Acute on chronic hypoxic respiratory failure secondary to aspiration pneumonia and chronic obstructive pulmonary disease.  CONSULTANTS: Dr. Harvie JuniorPhifer with palliative care.   IMAGING STUDIES: Include a CT scan of the head without contrast, which showed no acute abnormalities.   Chest x-ray, portable, showed persistent bilateral interstitial infiltrates.   ADMITTING HISTORY AND PHYSICAL: Please see detailed H and P dictated by Dr. Randol KernElgergawy on 04/14/2013. In brief, a 64 year old male patient with history of dementia secondary to alcohol abuse and drug abuse in the past with multiple hospitalizations secondary to aspiration pneumonia from chronic dysphagia who presented from nursing home due to lethargy, altered mental status and hypoxia. The patient was found to have aspiration pneumonia with chest x-ray, being febrile, hypotensive and hypoxic, admitted to the hospitalist service. The patient was DO NOT RESUSCITATE or DO NOT INTUBATE as per his healthcare power of attorney, his sister.   HOSPITAL COURSE: 1.  Acute hypoxic respiratory failure secondary to aspiration pneumonia. The patient has had similar presentations multiple times in the past. The patient was treated for aspiration pneumonia/healthcare acquired pneumonia with broad-spectrum antibiotics. He was on oxygen. Even by the day of discharge, he was on a significant amount of oxygen, up to 4 liters. The patient was also having COPD exacerbation, which was treated. The patient had modified barium swallow done which showed that he was aspirating all kinds of diet that was given to him, including pills. The patient I want seen by palliative care, Dr.  Harvie JuniorPhifer, who discussed with the sister, and secondary to recurrent admissions, poor prognosis and multiple comorbidities sister decided for comfort measures and discharged to hospice home.   By the day of discharge, the patient's mental status had cleared up, although he does have chronic confusion with dementia secondary to alcohol.   On day of discharge, the patient still has coarse breath sounds on both sides. He is on 4 liters oxygen saturating 92%.   DISCHARGE MEDICATIONS: Include:  1.  Olanzapine 2.5 mg oral once a day.  2.  Amitriptyline 100 mg oral once a day. 3.  Bupropion 150 mg oral once a day. 4.  Clonazepam 0.5 mg oral 2 times a day.  5.  Morphine 20 mg/mL oral concentrate 0.25/2.5 mL oral every 1 to 2 hours as needed for pain or shortness of breath.  DISCHARGE INSTRUCTIONS: Include diet as tolerated. Activity as tolerated. Follow-up at the hospice home.   TIME SPENT: On day of discharge in discharge activity was 40 minutes. ____________________________ Molinda BailiffSrikar R. Aja Whitehair, MD srs:sb D: 04/19/2013 14:42:57 ET T: 04/19/2013 15:43:11 ET JOB#: [id number]  cc: Wardell HeathSrikar R. Haven Pylant, MD, <Dictator> Orie FishermanSRIKAR R Annina Piotrowski MD ELECTRONICALLY SIGNED 04/22/2013 10:42

## 2014-11-21 NOTE — Consult Note (Signed)
PATIENT NAME:  Henry Burns, Henry Burns MR#:  [id number] DATE OF BIRTH:  [birth year]  DATE OF CONSULTATION:  10/15/2012  REFERRING PHYSICIAN:  Dr. Jacques NavyAhmadzia CONSULTING PHYSICIAN:  Rosalyn GessMichael E. Jermine Bibbee, MD  REASON FOR CONSULTATION:  Bacteremia and leukocytosis.   HISTORY OF PRESENT ILLNESS:  The patient is a 64 year old male with a past history significant for COPD, alcohol abuse and hyponatremia who was admitted on 10/07/2012 with a fall and generalized weakness. He was brought to the Emergency Room by EMS. He was found to be hypotensive and x-ray showed bilateral infiltrates. The patient states that he has a chronic cough and shortness of breath that has not changed for years. He has not had any recent fevers, chills or sweats. He states that the inciting incident that brought him to the Emergency Room was that he had gotten up to go the bathroom and had fallen and was too weak to get up. On admission, he was given a dose of levofloxacin in the Emergency Room and then changed to azithromycin and ceftriaxone. The patient has an ALLERGY LISTED TO KEFLEX which causes numbness and tingling. He took ceftriaxone for several days. His blood cultures became positive for methicillin-sensitive Staphylococcus aureus. His white count was elevated on admission to 37.0. It came down to as low as 15.6 on March 13th, but then has started coming back up. His antibiotics have been changed to vancomycin, Levaquin and Zosyn. His CT scan of the chest showed evidence for chronic emphysematous changes with some slight worsening in the bases. He has been satting very well on room air and he has had no fevers in the hospital.   ALLERGIES:  DARVON, KEFLEX, SKELAXIN, TIZANIDINE.   PAST MEDICAL HISTORY:   1.  Alcohol abuse with some dementia changes.  2.  Hyponatremia.  3.  COPD.  4.  Prostate cancer.  5.  Chronic back pain with degenerative disk disease.  6.  Pulmonary embolism.  7.  Depression.   SOCIAL HISTORY:  The patient lives  in what appears to be a group home, but it is unclear from his description whether this was a group home versus a licensed nursing facility. He is a prior drinker. He was a heavy smoker having quit just 2 weeks ago.   FAMILY HISTORY:  Positive for cardiac disease.   REVIEW OF SYSTEMS:  GENERAL: No fevers, chills or sweats. Positive generalized weakness. Positive fatigue. Some malaise.  HEENT: He is hard of hearing. No headaches. No sinus congestion. No sore throat. He does have postnasal drip.  NECK: No stiffness. No swollen glands.  RESPIRATORY: Positive chronic cough and sputum production which is unchanged from his baseline. No hemoptysis.  CARDIAC: No chest pains or palpitations. No peripheral edema.  GASTROINTESTINAL: Positive weight loss. No nausea, no vomiting, no abdominal pain. Positive constipation. No diarrhea.  GENITOURINARY: No complaints.  MUSCULOSKELETAL: No complaints.  SKIN: No rashes.  NEUROLOGIC: He has had generalized weakness, but no focal weakness.  PSYCHIATRIC: No complaints. All other systems are negative.   PHYSICAL EXAMINATION: VITAL SIGNS: T-max of 98.7, T-current 97.2, pulse 106, blood pressure 113/74, 98% on room air.  GENERAL: A 64 year old cachectic white man in no acute distress.  HEENT: Normocephalic, atraumatic. Pupils equal and reactive to light. Extraocular motion intact. Sclerae, conjunctivae and lids without evidence for emboli or petechiae. Oropharynx shows no erythema or exudate. Gums are in fair condition.  NECK: Supple. Full range of motion. Midline trachea. No lymphadenopathy. No thyromegaly.  LUNGS: Clear to auscultation bilaterally with  the exception of some crackles in the bases.  CARDIAC: Regular rate and rhythm without murmur, rub or gallop.  ABDOMEN: Soft, nontender, and nondistended. No hepatosplenomegaly. No hernia is noted.  EXTREMITIES: No evidence for tenosynovitis. He has loss of muscle mass in all 4 extremities.  SKIN: No rashes. No  stigmata of endocarditis, specifically no Janeway lesions or Osler nodes.  NEUROLOGIC: The patient was hard of hearing, but was able to answer questions. He did not appear to be particularly confused, but was not a great historian.  PSYCHIATRIC: Mood and affect appeared normal.   DIAGNOSTIC DATA:  BUN 25, creatinine 0.41, sodium of 133, bicarbonate 32, anion gap 4. LFTs were unremarkable. White count is 20.4 with hemoglobin 10.5, platelet count of 303, ANC of 17.4. White count on admission was 37.0, but had come down to 15.6 on March 13th and then started rising again. Blood cultures from admission are growing 1 of 4 bottles positive for methicillin-sensitive Staphylococcus aureus. A urinalysis had trace leukocyte esterase leukocytes, 3+ blood, 3616 red cells and 45 white cells. Urine culture showed no growth. Blood cultures repeated on March 12th showed no growth. Sputum culture showed normal flora. An echocardiogram showed EF of 50 to 55%, low normal global left ventricular systolic function, mildly dilated left atrium, mild thickening of the anterior and posterior mitral valve leaflets, moderate to severe mitral valve regurgitation, mild elevated pulmonary artery systolic pressure, mild tricuspid regurgitation. There were no specific comments on vegetations. A chest x-ray from admission showed bilateral infiltrates worse on the right. A CT scan of the chest without contrast from yesterday showed chronic interstitial lung disease superimposed on emphysema that was slightly increased from prior chest CT.   IMPRESSION:  A 64 year old male with a history significant for chronic obstructive pulmonary disease, alcohol abuse, and hyponatremia admitted with methicillin-sensitive Staphylococcus aureus bacteremia.   RECOMMENDATIONS:   1.  Review of the CT scan does not show an obvious pneumonia. He has some increase findings that are noted to be mild changes. It is unclear to me that he has pneumonia. It is  possible that he could have aspirated. He has chronic cough and shortness of breath which are not worse from his baseline. He is saturating well on room air. Sputum culture is negative. He could have aspirated which would necessarily require antibiotics.  2.  HE HAS AN ALLERGY TO KEFLEX and was put on ceftriaxone. He reports that his allergy was numbness and tingling rather than rash, but if he is having a reaction to ceftriaxone, this could cause the increase in white count after it had improved.  3.  I would not treat his methicillin-sensitive Staphylococcus aureus with levofloxacin. We will start oxacillin.  4.  There are several valvular abnormalities noted on the transthoracic echo, but vegetations were not commented upon. As he has Staphylococcus aureus bacteremia without obvious source, he will need a TEE.  5.  We will treat with oxacillin for 2 to 4 weeks depending on the results of the TEE.  6.  We will follow his CBC.  7.  We will discontinue the vancomycin, Zosyn and levofloxacin.  8.  Given his leukocytosis if he develops diarrhea, I would send a C. difficile PCR.   This is a high-level infectious disease consult.   Thank you very much for involving me in this patient's care.    ____________________________ Rosalyn Gess. Zen Cedillos, MD meb:si D: 10/15/2012 15:14:00 ET T: 10/15/2012 16:32:22 ET JOB#: [id number]  cc: Rosalyn Gess. Aviyanna Colbaugh, MD, <  Dictator> Julita Ozbun E Reeves Musick MD ELECTRONICALLY SIGNED 10/16/2012 14:06

## 2014-11-21 NOTE — Consult Note (Signed)
   Comments   Henry Borders, NP, and I had a long discussion with pt about code status. Though pt initially said that he wanted to be a full code, he decided against it when I explained to him what that entails. He was under the assumption that being intubated was like have [name] and that he could have it at home. He also said he did not want CPR/defibrillation when I described that to him. As this was also the wish of his sister, Henry Burns, who is pt's only relative, I feel that it is appropriate to chage pt to DNR. I spoke with Henry Burns, caregiver at pt's group home and updated her. Explained that pt would return with an out-of-facility DNR form.   Electronic Signatures: [name], [name] (MD)  (Signed 04-Aug-14 14:53)  Authored: Palliative Care   Last Updated: 04-Aug-14 14:53 by [name], [name] (MD)

## 2014-11-21 NOTE — H&P (Signed)
NAME:  Henry Burns, Henry Burns MR#:  [id number] DATE OF BIRTH:  Jan 14, 1951  DATE OF ADMISSION:  10/07/2012  PRIMARY CARE PHYSICIAN: Dr. Terance Hart.   The patient is a 64 year old Caucasian male with a past medical history significant for psychosis,   with admission for the same in October 2011. At that time he had problems with hyponatremia, also a history of chronic alcoholism as well as chronic hyponatremia, who presented to the hospital via EMS due to weakness. Apparently he came from home with progressing generalized weakness, previously described via EMS. He just presented from home with generalized weakness. He also was complaining of pain in lower back as well as sacral area. He was noted to be hypotensive, with systolic blood pressure in his 90s, and a chest x-ray revealed pneumonia, and hospitalist services were contacted for admission. He also admits to having some cough, sputum production which seems to be clear, as well as a sore throat. He is afebrile in the Emergency Room.   PAST MEDICAL HISTORY:  Significant for history of psychosis; admission in October 2011, a history of chronic alcoholism, COPD, tobacco abuse; quit tobacco smoking tobacco 2 weeks ago, dementia, prostate cancer. He has chronic back pain on opiates in the past. History of hyponatremia with sodium levels of 125 to 132, usually. Past medical history is also significant for arthritis, degenerative disk disease, chronic back pain, tobacco abuse, pulmonary embolism, COPD, alcohol abuse, prostate cancer, as well as depression.   ALLERGIES: DARVON; GI DISTRESS; KEFLEX, SKELAXIN, TIZANIDINE. This is unclear, however in the past he was using Combivent.   According to the medical records the patient is on Advair discus 250/50, one puff twice daily, iron sulfate 325 mg p.o. twice daily, folic acid 1 mg p.o. once daily. In the past he was on Klonopin 0.5 mg p.o. twice daily; it is unclear if he is still using this medication; multivitamins,  once daily, Norco 375/5 mg 1 tablet every 6 hours as needed, olanzapine 2.5 mg p.o. twice daily, thiamine 1 mg p.o. once daily, Zyprexa 0.25 mg p.o. once daily.   ALLERGIES:  According to medical records, as mentioned above, the patient is ALLERGIC TO DARVON, KEFLEX, SKELAXIN, and TIZANIDINE.   PAST SURGICAL HISTORY:  Knee replacement.   FAMILY HISTORY:  Positive for heart disease, according to medical records.   SOCIAL HISTORY:  Is not available at this time. The patient has significant hearing loss and he has difficulty in understanding me and is not answering questions.   REVIEW OF SYSTEMS: Difficult to obtain due to hearing loss. Admits of having some dizziness, sore throats for the past 1 week. Some cough with clear phlegm production,  wheezing as well as shortness of breath and tightness in his chest. Otherwise, denies any fatigue, weakness, pains, weight loss or gain.  EYES: Denies any blurry vision, double vision, glaucoma or cataracts.  ENT: Denies any tinnitus, allergies, epistaxis, sinus pain, dentures, sore throat, difficulties with  swallowing.  RESPIRATORY: Denies any wheezes, hoarseness, COPD.  CARDIOVASCULAR: Denies any chest pains, orthopnea, edema, arrhythmias, palpitations, or syncope. GASTROINTESTINAL:  Denies any nausea, vomiting, diarrhea, constipation.  GENITOURINARY: Denies frequency, hematuria, or incontinence.  ENDOCRINOLOGY: Denies any polydipsia, nocturia, thyroid problems, heat or cold intolerance or thirst.  HEMATOLOGIC: Denies anemia, easy bruising, bleeding from small cuts.  SKIN: Denies acne, rashes, lesions or change in moles.  MUSCULOSKELETAL: Denies arthritis, cramps, swelling. There is no evidence of trauma.   VITAL SIGNS:  On arrival to the hospital the patient's vital  signs, temperature is 98.9, pulse 105, respiratory rate was 20, blood pressure 92/61, saturation 95% on room air.  GENERAL: This is a well-developed, thin, even cachectic-looking male in no  significant distress, somewhat anxious lying on the stretcher.  HEENT: Pupils are equal and reactive. Extraocular muscles intact. No icterus or conjunctivitis. He has significant difficulty hearing despite hearing aids in his ears. No pharyngeal erythema. Mucosa is moist.  NECK: No masses, supple, nontender. No lymphadenopathy or JVD. No carotid bruits auscultated. Full range of motion.  LUNGS: Crackles bilaterally, especially on the right side. Rales as well as rhonchi. Somewhat diminished breath sounds bilaterally, however mostly on the right side. No wheezing. The patient does have labored inspirations as well as increased effort to breathe. He is in mild respiratory distress.   CARDIOVASCULAR: S1, S2 appreciated. No gross deficits. Hepatojugular, I had difficulty to hear because of adventitial sounds from lungs. PMI is  not lateralized.  CHEST: Nontender to palpation. 1+ pedal pulses. No lower extremity edema, clubbing or cyanosis was noted.  ABDOMEN: Soft and nontender. No hepatosplenomegaly or masses were noted.  RECTAL: Deferred.  MUSCLE STRENGTH: Able to move all extremities. No cyanosis, degenerative joint disease or kyphosis.  SKIN: Denies any rashes, lesions, erythema, nodularity, induration. It was warm and dry to palpation.  LYMPHATICS: No adenopathy in the cervical region.  NEUROLOGIC: Cranial nerves grossly intact. Sensory is intact. No dysarthria or aphasia. The patient is alert, oriented to person and place, cooperative, however due to significant hearing loss is very difficult to get full cooperation. Not able to assess his memory.   LABORATORY DATA: BMP showed a sodium of 119, otherwise BMP was unremarkable. The patient's albumin level was 3.1, AST elevated to 40. Troponin is 0.02. White blood cell count is elevated to 37.0, hemoglobin was 11.6, and platelet count was not able to be reported; lower than 140 according to medical records. MCV low at 76.   EKG showed normal sinus  rhythm at 94 beats per minute, possible left atrial enlargement. According to EKG criteria, low-voltage QRS. Cannot rule out inferior infarct with Q waves in inferior leads, age undetermined, also lateral ischemia with ST depressions in V3 through V4 as well as flattening in the high lateral leads. In comparison to prior EKG done in October 2011, no significant difference was noted in inferior leads. The patient had, at that time, also Q waves in inferior leads, however the patient did have normal T waves in V3 through V5, which is different today. The patient does have depressions which are new since October 2011.   Chest x-ray, according to radiologist, PA and lateral, revealed bilateral pneumonia, worse on the right, which appears to be predominantly in the right middle lobe, involved possibly with some bilateral lower lobe and lingular involvement. Cannot exclude  minimal underlying nodularity at the left lung base. Radiographic followup to document clearing was recommended, assuming the patient has clinical and laboratory evidence of active infectious or inflammatory process. Lumbar spine AP and lateral on the 10/07/2012, compression fracture of L1 vertebral body anteriorly, of indeterminate age, significant amount of air and fecal material in colon, correlates for constipation, according to radiologist.   ASSESSMENT AND PLAN: 1.  Systemic inflammatory response reaction due to pneumonia: Admit patient to the medical floor. Get blood cultures as well as sputum cultures, and will treat patient on antibiotic therapy.  2.  Hypertension. Questionable septic shock: Continue IV fluids for now.  3.  Weakness: Will admit patient. Will continue IV  fluids. We will get physical therapy involved.  4.  Bilateral pneumonia: Will start the patient on Rocephin as well as Zithromax. We will get sputum cultures as well as blood cultures, and we will follow results.  5.  Chronic obstructive pulmonary disease exacerbation:  We will start Solu-Medrol.  6. Hyponatremia: Will continue IV fluids for now to replenish intravascular depletion, and will follow the patient's sodium in the morning.  7.  Hypotension: As mentioned above, will continue IV fluids and will add pressors if needed.   Time spent: Fifty minutes on the patient.    ____________________________ Katharina Caperima Copper Kirtley, MD rv:dm D: 10/07/2012 12:22:39 ET T: 10/07/2012 12:47:36 ET JOB#: [id number]  cc: Katharina Caperima Dally Oshel, MD, <Dictator> Teena Iraniavid M. Terance HartBronstein, MD Katharina CaperIMA Jamilee Lafosse MD ELECTRONICALLY SIGNED 11/01/2012 14:44

## 2014-11-21 NOTE — H&P (Signed)
PATIENT NAME:  Henry Burns, Henry Burns MR#:  [id number] DATE OF BIRTH:  1951-02-23  DATE OF ADMISSION:  03/03/2013  PRIMARY CARE PROVIDER: Dr. Lovie Macadamia.   CHIEF COMPLAINT: Status post fall.   HISTORY OF PRESENT ILLNESS: The patient is a 64 year old white male who was brought into the ED after he had fallen. The patient also was a little lethargic and was noted to have severe hyponatremia. He also was noted to have elevated WBC count and was noted to have pneumonia.   The patient presented with a similar type of presentation during his previous hospitalization that was in March 2014. At that time had MRSA bacteremia and had possible aspiration pneumonia.   The patient currently is sleepy, and is able to awake and is mumbling and I am not able to understand what he is saying. Review of systems is unobtainable, therefore.   He also was noted to have low blood pressure on presentation, which has since improved.   PAST MEDICAL HISTORY:  1. History of methicillin-sensitive Staphylococcus aureus staph aureus bacteremia in the past.  2.  History of generalized weakness.  3.  History of aspiration pneumonia.  4.  History of dysphagia.  5.  COPD.  6.  Hypotension.  7.  History of chronic hyponatremia, with his last sodium on April 16th of 123.  8.  History of agitation, with psychosis in the past.  9.  History of chronic anemia.  10.  History of chronic alcohol abuse in the past.  11.  Dementia.  12.  History of prostate cancer.  13.  History of chronic back pain.  14.  Degenerative disk disease.  15.  History of pulmonary embolism.  16.  History of depression.  17.  Tobacco abuse in the past.  18.  History of arthritis.   ALLERGIES: DARVON, KEFLEX, SKELAXIN, TIZANIDINE.   PAST SURGICAL HISTORY: Status post knee replacement in the past.   FAMILY HISTORY: Positive for heart disease, according to medical records.   SOCIAL HISTORY: Is not currently available. He used to smoke, according to his  previous history. History of alcohol abuse in the past.   CURRENT MEDICATIONS: Currently his active list is not available. We will try to obtain this.   REVIEW OF SYSTEMS: The patient is currently a little lethargic. Opens his eyes but is mumbling, unable to give me any history.   PHYSICAL EXAMINATION:  VITAL SIGNS: Temperature 97.8, pulse 103, respirations 22, blood pressure 82/58.  GENERAL: The patient is a critically ill-appearing male. Appears very dehydrated.  HEENT: Head atraumatic, normocephalic. Pupils equally round, reactive to light and accommodation. There is no conjunctival pallor. Unable to do extraocular movements. Nasal exam shows no drainage or ulceration. External ear exam shows no drainage or lesions.  OROPHARYNX: Very dry.  NECK: Supple, without any JVD or carotid bruits.  CARDIOVASCULAR: Regular rate and rhythm. No murmurs, rubs, clicks or gallops. PMI is not displaced.  LUNGS: He has bilateral rhonchi throughout both lungs. There are no crackles or wheezing.  ABDOMEN: Soft, nontender, nondistended. Positive bowel sounds x 4.  EXTREMITIES: No clubbing, cyanosis, or edema.  SKIN: No rash.  LYMPHATICS: No lymph nodes palpable.  VASCULAR: Good DP, PT pulses.  GENITOURINARY: Deferred.  MUSCULOSKELETAL: There is no erythema or swelling.  NEUROLOGIC: The patient is awake but not able to follow commands. Moving spontaneously all extremities.  PSYCHIATRIC: The patient is confused.   EVALUATIONS: Glucose was 91, BUN 20, creatinine 0.90, sodium 116, potassium 5.0, chloride 84, CO2 is 24,  calcium 8.6.   LFTs: Total protein 7.6, albumin 2.7, bili total 0.4, alk phos is 106, AST 84, ALT is 22.   CPK is 4064, troponin less than 0.02.   WBC 29.4, hemoglobin 11.1, platelet count 126. Urinalysis is negative. Lactic acid 1.7.   Chest x-ray with bilateral infiltrates.   ASSESSMENT AND PLAN: The patient is a 64 year old brought to the hospital with altered mental status:   1.   Acute encephalopathy: Likely due to pneumonia and cerebrovascular accident or dehydration, with severe hypernatremia. At this time will treat his underlying cause, monitor his mental status.  2.  Healthcare-associated pneumonia: At this time, will treat him with IV Levaquin, vancomycin. 3.  Severe hyponatremia, likely due to dehydration: Will give him normal saline, follow his sodium levels.  4. Hypotension: Likely due to sepsis. Will give him aggressive IV fluids. Check a random cortisol.  5.  Dementia.  6. CODE STATUS: FULL. WILL GET PALLIATIVE CARE TEAM TO COME EVALUATE THE PATIENT.  7.  History of dysphagia: I will get a swallow evaluation.   Note fifty minutes of critical care time spent.     ____________________________ Henry Burns. Posey Pronto, MD shp:dm D: 03/03/2013 10:42:03 ET T: 03/03/2013 11:25:36 ET JOB#: [id number]  cc: Skyanne Welle H. Posey Pronto, MD, <Dictator> Alric Seton MD ELECTRONICALLY SIGNED 03/04/2013 9:03

## 2014-11-21 NOTE — Discharge Summary (Signed)
PATIENT NAME:  Henry Burns, Henry Burns MR#:  [id number] DATE OF BIRTH:  01/06/1951  DATE OF ADMISSION:  03/03/2013 DATE OF DISCHARGE:  03/07/2013   PRIMARY CARE PHYSICIAN: David M. Bronstein, MD  CODE STATUS: No code, DNR.   PRESENTING COMPLAINT: Status post fall.   DISCHARGE DIAGNOSES:  1. Acute encephalopathy secondary to pneumonia, dehydration. Resolved.  2. Sepsis secondary to aspiration pneumonia.  3. Chronic dysphagia with high risk of aspiration.  4. Hyponatremia due to volume depletion, improved.  5. Hypotension due to sepsis, improved.  6. Chronic dementia.  7. Hydrocephalus, chronic.  8. Acute mild rhabdomyolysis, resolved with IV fluids.  9. Chronic anemia.   MEDICATIONS AT DISCHARGE: 1. Tylenol 650 p.o. q.4 p.r.n.  2. Calcium carbonate 500 b.i.d.  3. Klonopin 0.5 mg b.i.d.  4. Amitriptyline 100 mg at bedtime.  5. Wellbutrin SR 150 mg p.o. q.24.  6. Folic acid 1 mg p.o. daily.  7. Thiamine 100 mg p.o. daily.  8. Reglan 5 mg p.o. t.i.d.  9. Levaquin 750 p.o. daily for 4 more days.  10. Ultram 50 mg q.6 p.r.n.  11. Advair 250/50 one puff b.i.d.  12. Zyprexa 2.5 at bedtime.  13. Ferrous sulfate 325 p.o. daily.  14. Protonix 40 mg daily.  15. DuoNebs q.4 p.r.n.   DISCHARGE INSTRUCTIONS:  1. PT.  2. Speech to follow.  3. Strict aspiration precautions.   DIET: Dysphagia 3, chopped, nectar-thick liquid. Magic Cup b.i.d. and Ensure 1 can t.i.d.   CONSULTATIONS: Dr. Phifer, palliative care. Speech therapy, physical therapy.   BRIEF SUMMARY OF HOSPITAL COURSE: Henry Burns is a 64-year-old Caucasian gentleman who has history of chronic dementia, history of hydrocephalus and history of chronic dysphagia, who is brought to the hospital with altered mental status. He was admitted with:   1. Acute encephalopathy, which was likely due to sepsis in the setting of pneumonia and dehydration, which improved, and the patient is back at his baseline.  2. Possible aspiration  pneumonia. The patient's chest x-ray showed increasing right lower lobe pneumonia. He has history of chronic dysphagia. Speech therapy saw the patient and recommended mechanical soft dysphagia 3 diet with nectar-thick liquids. He was started on IV Levaquin, meropenem and vancomycin. Blood cultures remained negative. Vancomycin was discontinued. The patient will finish up a 10-day course total remaining with p.o. Levaquin. His white count was normalized, and his blood cultures remained negative. The patient remained afebrile.  3. Hypotension due to sepsis, improved with IV hydration.  4. Hyponatremia due to dehydration, resolved after IV fluids.  5. Acute mild rhabdomyolysis, possibly due to fall, resolved. CPK trended down to 400s after receiving IV fluids.  6. Anemia of chronic disease, microcytic anemia. Resumed iron pills. 7. Physical therapy recommended rehabilitation. The patient will continue rehabilitation at the nursing home.  8. Palliative care consultation was made regarding outcome and goals of treatment. Met with the patient's sister and discussed code status, and the patient is a no code, DNR.  9. Hospital stay otherwise remained stable.   TIME SPENT: 40 minutes.   ____________________________ Sona A. Patel, MD sap:OSi D: 03/07/2013 12:42:04 ET T: 03/07/2013 12:56:07 ET JOB#: [id number]  cc: Sona A. Patel, MD, <Dictator> David M. Bronstein, MD SONA A PATEL MD ELECTRONICALLY SIGNED 03/21/2013 7:30 

## 2014-11-23 IMAGING — CR DG CHEST 2V
1 series · 3 of 3 positions shown · non-contrast
Comparison: none

REASON FOR EXAM: cough
COMMENTS:

[Series 1: ap · 0.17mm/px · 3 of 3 slices shown]
[im 1/3]
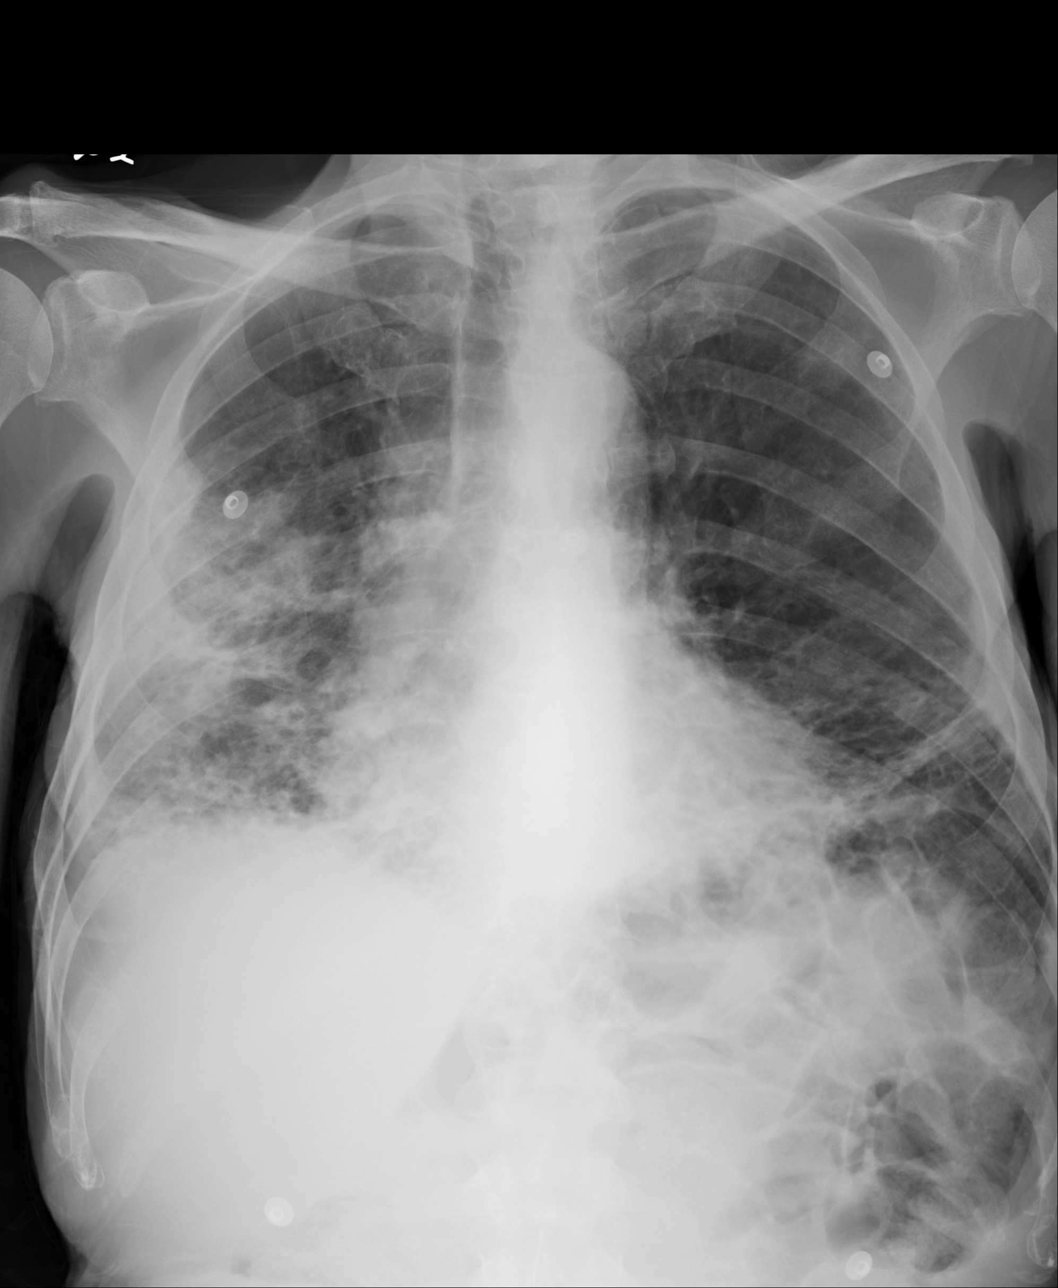
[im 2/3]
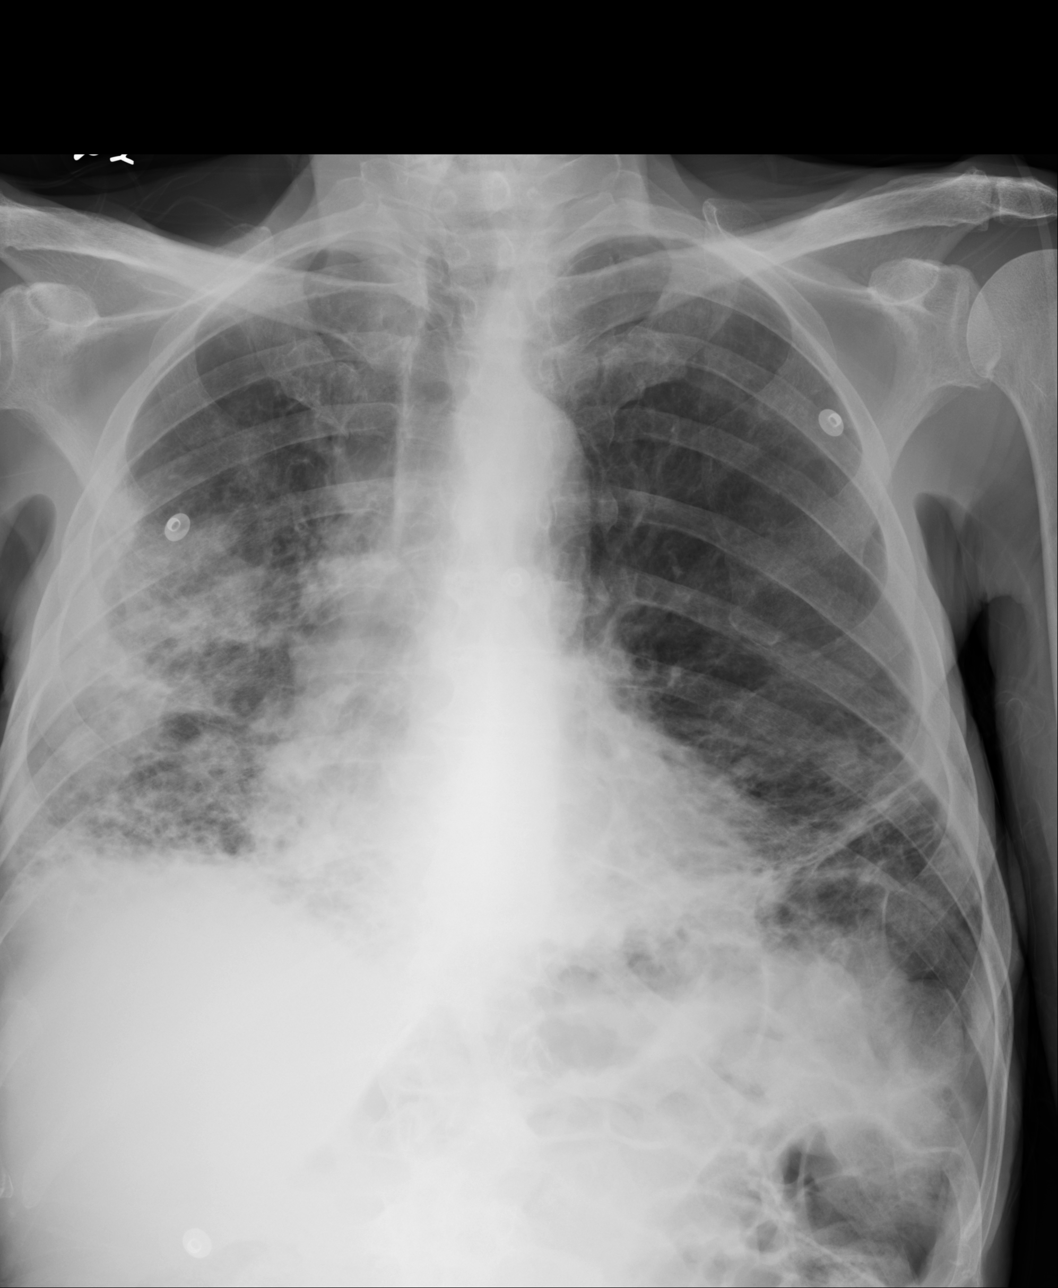
[im 3/3]
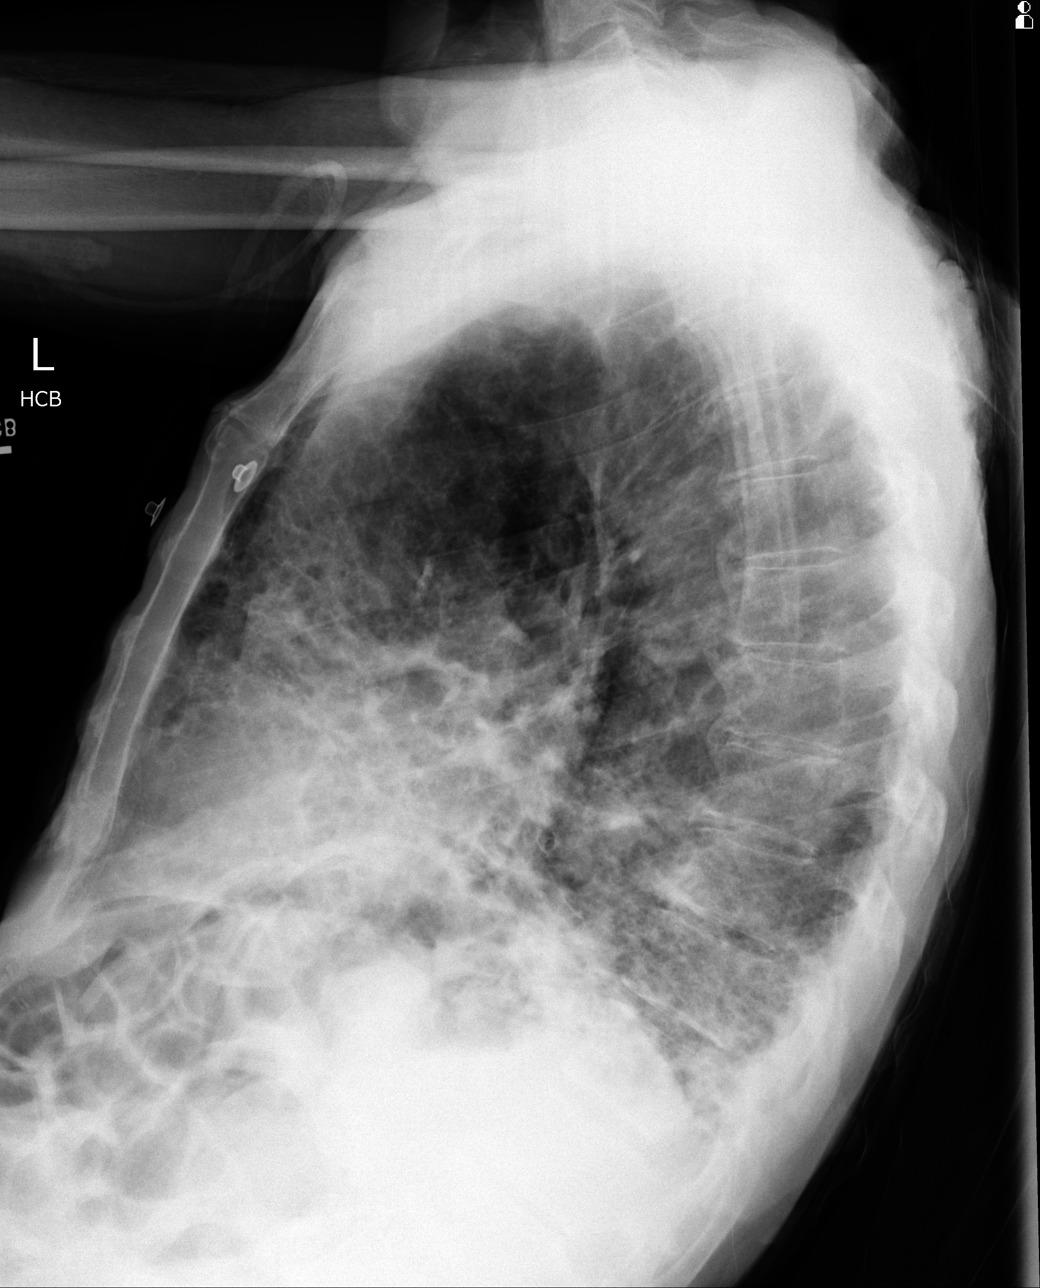

[3 of 3 positions shown; findings below may reference images not displayed]

PROCEDURE:     DXR - DXR CHEST PA (OR AP) AND LATERAL  - October 07, 2012  [DATE]

RESULT:     Comparison is made to the study 06 May, 2010.

There is significant increased density in the lung bases right greater than
left with right middle lobe pneumonia suspected with some minimal right
lower lobe and possibly left lower lobe involvement. There is nodularity
projecting inferior to the left fourth rib anterior portion which could
represent superimposition of structures such as a nipple shadow. Followup PA
and lateral views the chest are recommended to document resolution.
IMPRESSION: 1. Bilateral pneumonia worse on the right that appears to be predominantly
right middle lobe involvement possibly with some bilateral lower lobe or
lingular involvement as well. Cannot exclude some minimal underlying
nodularity in the left lung base. Radiographic followup to document clearing
is recommended, assuming the patient has clinical and laboratory evidence of
an active infectious or inflammatory process.

[REDACTED]

## 2014-11-28 IMAGING — CR DG CHEST 1V PORT
1 series · 1 of 1 positions shown · non-contrast
Comparison: none

REASON FOR EXAM: aspirated
COMMENTS:

[ap]
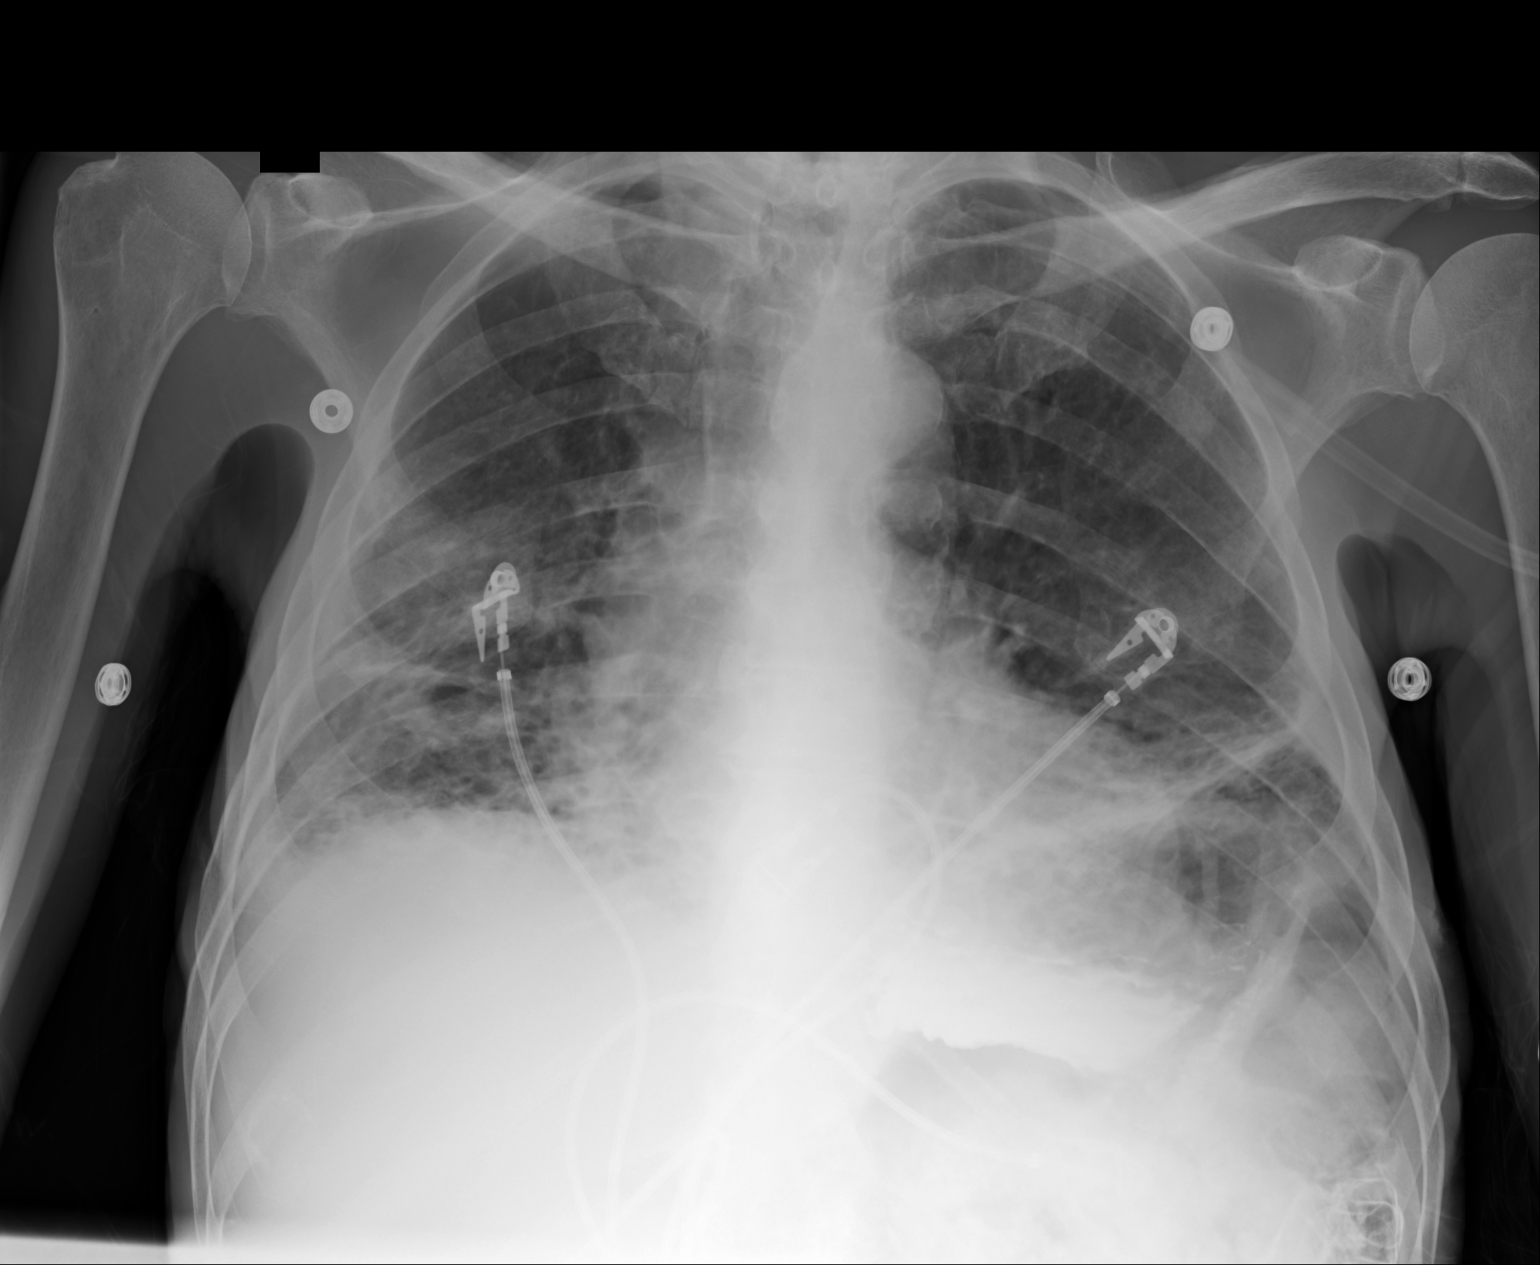

[1 of 1 positions shown; findings below may reference images not displayed]

PROCEDURE:     DXR - DXR PORTABLE CHEST SINGLE VIEW  - October 12, 2012 [DATE]

RESULT:     Comparison is made to the study October 10, 2012.

The lung volumes remain low. Bibasilar atelectasis is present. I cannot
exclude small amounts of pleural fluid layering inferiorly. The cardiac
silhouette is top normal in size. The pulmonary vascularity is not clearly
engorged.
IMPRESSION: There is persistent atelectatic change at the lung bases.
Increased interstitial markings are noted at both lung bases which may
reflect an element of interstitial pneumonia. The findings could certainly
reflect the clinically suspected aspiration.

[REDACTED]

## 2014-11-30 IMAGING — CT CT CHEST W/O CM
1 series · 15 of 34 positions shown, 19 images · non-contrast
Comparison: none

REASON FOR EXAM: sob.re-eval the pna
COMMENTS:

PROCEDURE:     CT  - CT CHEST WITHOUT CONTRAST  - October 14, 2012  [DATE]
RESULT:     Comparison: CT of the chest 04/17/2012, portable chest radiograph
10/12/2012
TECHNIQUE: Multiple axial images of the chest were obtained without
intravenous contrast.

[Series 2: soft tissue · axial · 0.68mm/px · z∈[-819,-576]mm · 15 of 95 slices shown, 19 images]
[im 7/95  mediastinal]
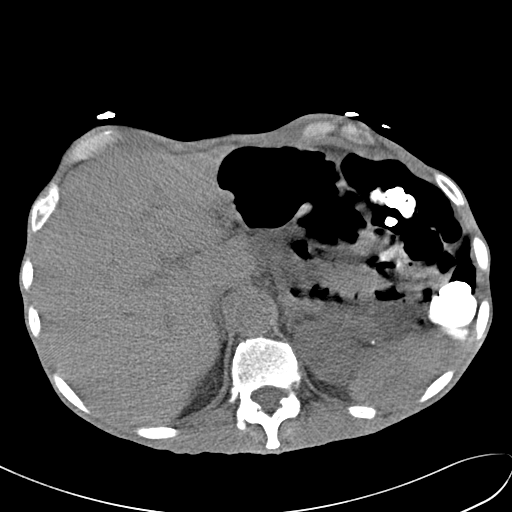
[im 7/95  lung]
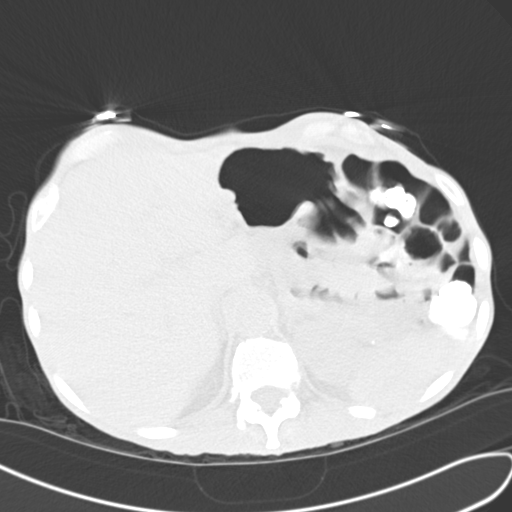
[im 14/95  lung]
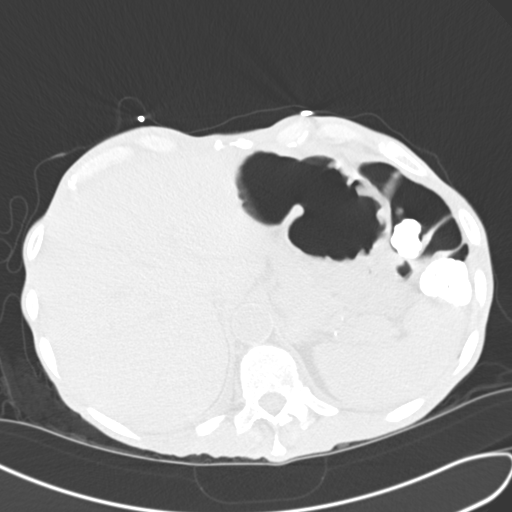
[im 19/95  lung]
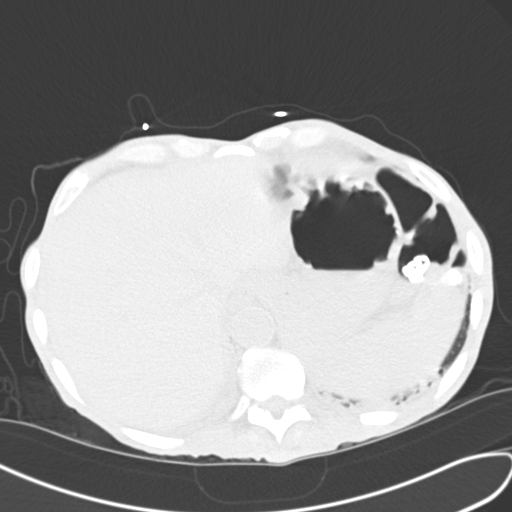
[im 25/95  lung]
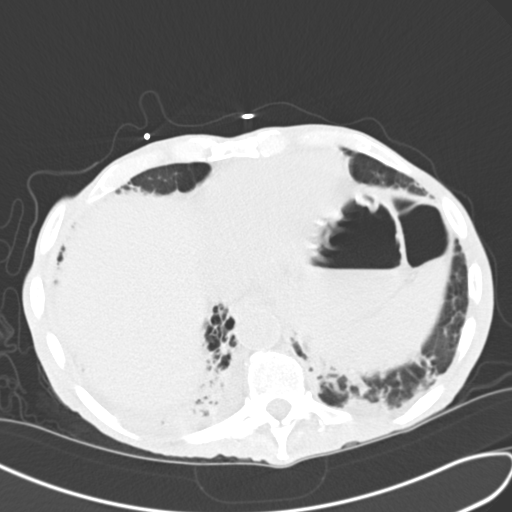
[im 32/95  mediastinal]
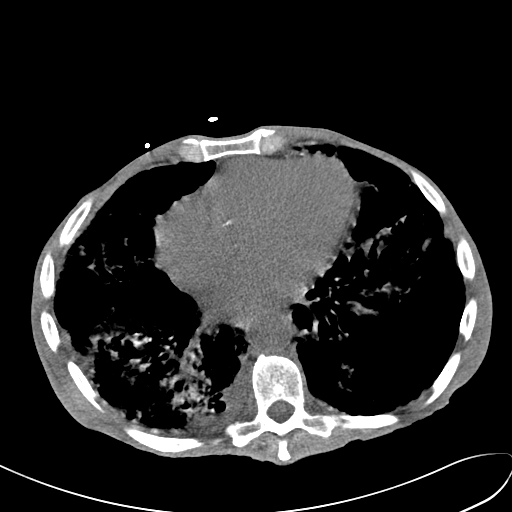
[im 32/95  lung]
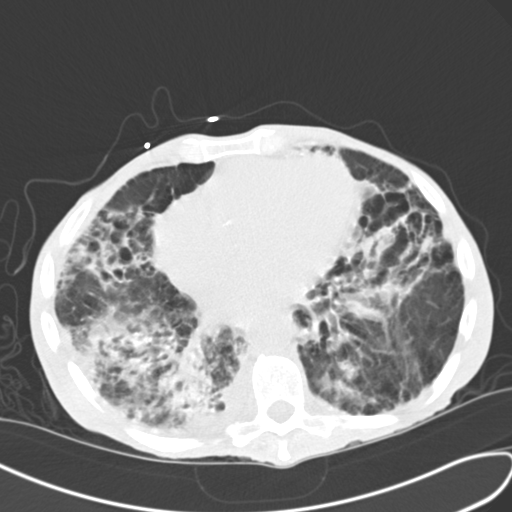
[im 38/95  lung]
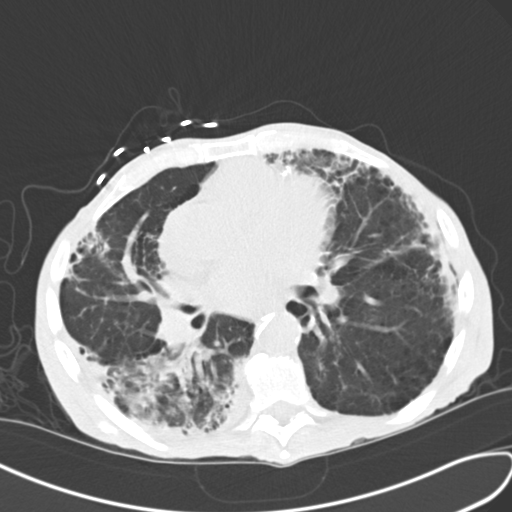
[im 42/95  lung]
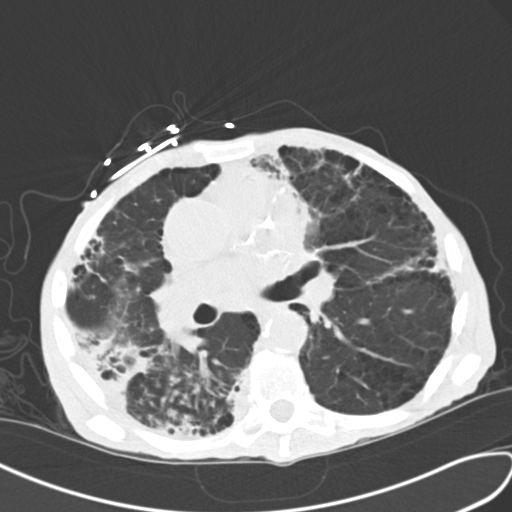
[im 49/95  lung]
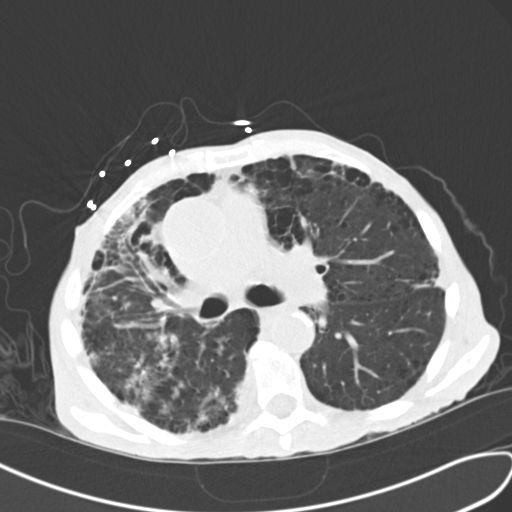
[im 53/95  mediastinal]
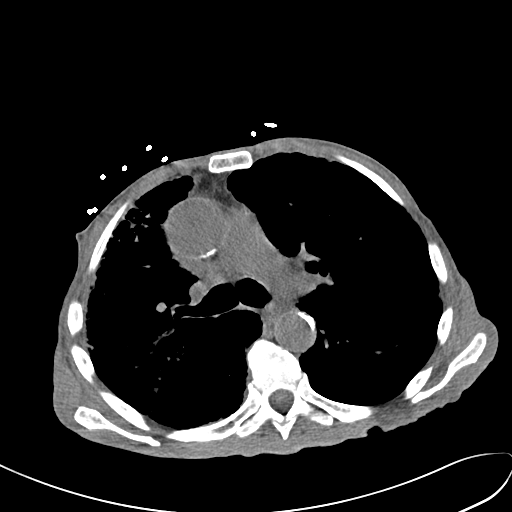
[im 53/95  lung]
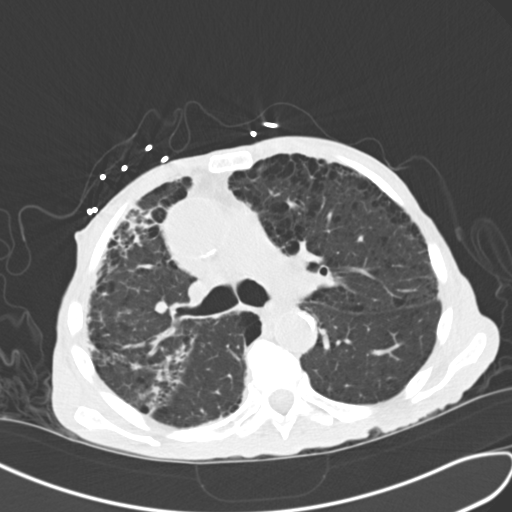
[im 57/95  lung]
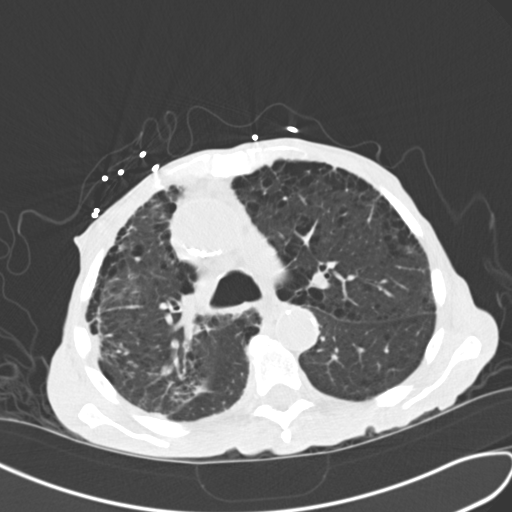
[im 63/95  lung]
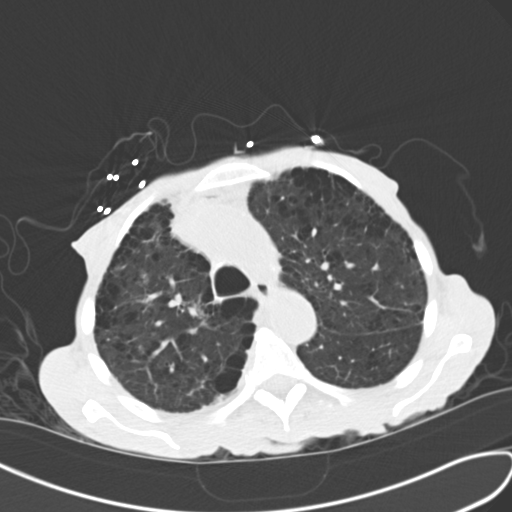
[im 70/95  lung]
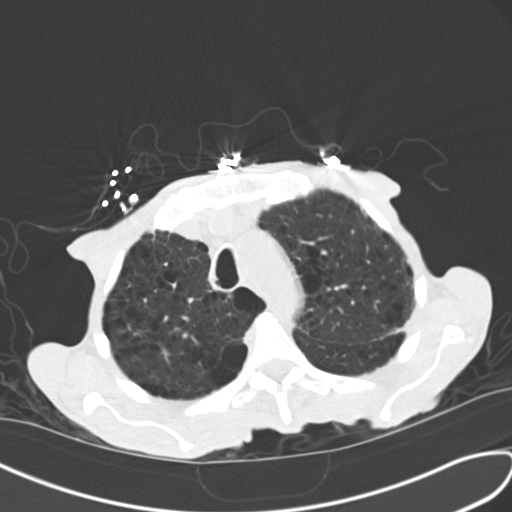
[im 76/95  mediastinal]
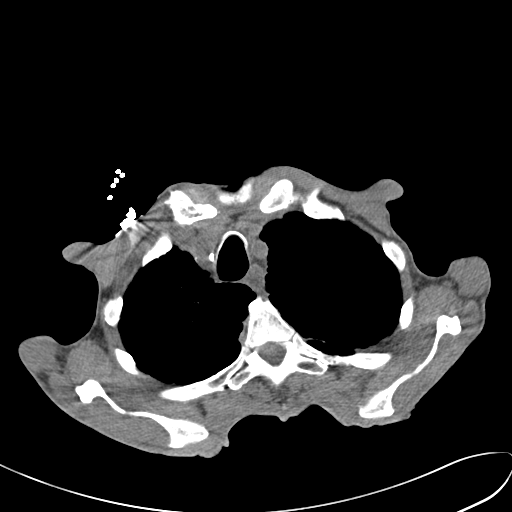
[im 76/95  lung]
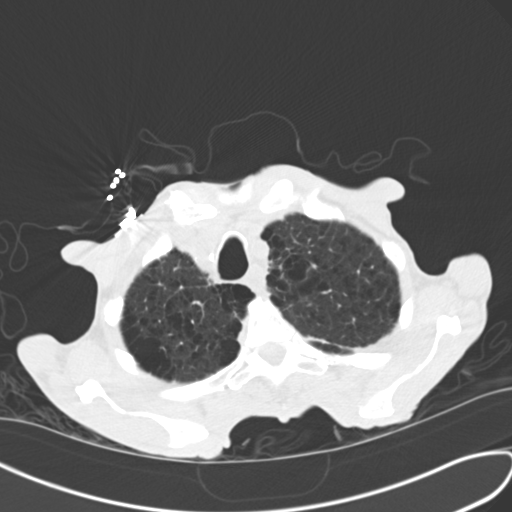
[im 81/95  lung]
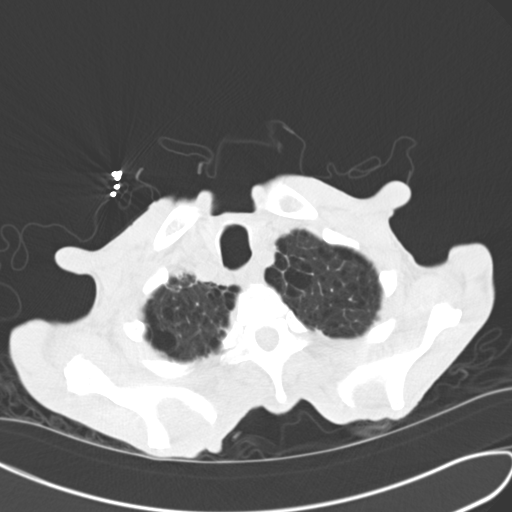
[im 88/95  lung]
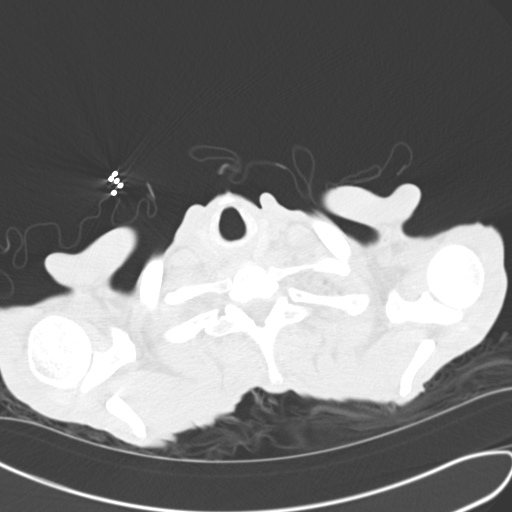

[15 of 34 positions shown; findings below may reference images not displayed]

FINDINGS: No mediastinal, hilar, or axillary lymphadenopathy. There is a small focus
of air in the left brachiocephalic vein. There are extensive coronary artery
calcification. There is a trace right pleural effusion. Residual oral
contrast material is incompletely visualized in the colon.

There is moderate centrilobular emphysema. There are heterogeneous opacities
with areas of consolidation and interlobular septal thickening involving
lobes of the lung, greatest and most confluent in the right lower lobe.
Overall, these findings are slightly increased from prior. For example, the
opacities in the posterior right upper lobe and posterior superior right
lower lobe are increased from prior. There are cystic changes in the lung
bases.

No aggressive lytic or sclerotic osseous lesions are identified.
IMPRESSION: Findings likely represent chronic interstitial lung disease superimposed on
a background of emphysema. Overall, the findings are increased from prior
chest CT. This may represent worsening chronic interstitial lung disease
versus superimposed infection or aspiration. Pulmonary consultation and
continued followup are recommended.

[REDACTED]
